# Patient Record
Sex: Female | Born: 1975 | Hispanic: Yes | Smoking: Never smoker
Health system: Southern US, Community
[De-identification: ages and names within clinical notes are randomized; demographics above are authoritative.]

---

## 2010-11-27 ENCOUNTER — Emergency Department: Payer: Self-pay | Admitting: Emergency Medicine

## 2013-10-07 HISTORY — PX: BREAST SURGERY: SHX581

## 2017-10-07 HISTORY — PX: LIPOSUCTION: SHX10

## 2019-02-10 ENCOUNTER — Other Ambulatory Visit: Payer: Self-pay | Admitting: Family Medicine

## 2019-02-10 DIAGNOSIS — Z369 Encounter for antenatal screening, unspecified: Secondary | ICD-10-CM

## 2019-02-11 ENCOUNTER — Other Ambulatory Visit: Payer: Self-pay | Admitting: Family Medicine

## 2019-02-11 DIAGNOSIS — O26851 Spotting complicating pregnancy, first trimester: Secondary | ICD-10-CM

## 2019-02-11 DIAGNOSIS — Z3481 Encounter for supervision of other normal pregnancy, first trimester: Secondary | ICD-10-CM

## 2019-02-15 ENCOUNTER — Other Ambulatory Visit: Payer: Self-pay

## 2019-02-15 ENCOUNTER — Ambulatory Visit
Admission: RE | Admit: 2019-02-15 | Discharge: 2019-02-15 | Disposition: A | Discharge status: Home / Self Care | Payer: Self-pay | Source: Ambulatory Visit | Attending: Maternal & Fetal Medicine | Admitting: Maternal & Fetal Medicine

## 2019-02-15 ENCOUNTER — Ambulatory Visit: Admission: RE | Admit: 2019-02-15 | Payer: Self-pay | Source: Ambulatory Visit

## 2019-02-15 DIAGNOSIS — O09521 Supervision of elderly multigravida, first trimester: Secondary | ICD-10-CM | POA: Insufficient documentation

## 2019-02-15 NOTE — Progress Notes (Addendum)
Virtual Visit via Video Note  I connected with Debbie Burns on Feb 15, 2019 at  3:00 PM EDT by a video enabled telemedicine application and verified that I am speaking with the correct person using two identifiers.  Referring Provider:   Charlynne Pander, [facility] Length of Consultation: 55 minutes  Ms. Debbie Burns was referred to [facility] [place] for genetic counseling because of advanced maternal age.  The patient will be 43 years old at the time of delivery.  This note summarizes the information we discussed.    We explained that the chance of a chromosome abnormality increases with maternal age.  Chromosomes and examples of chromosome problems were reviewed.  Humans typically have 46 chromosomes in each cell, with half passed through each sperm and egg.  Any change in the number or structure of chromosomes can increase the risk of problems in the physical and mental development of a pregnancy.   Based upon age of the patient, the chance of any chromosome abnormality was 1 in 6. The chance of Down syndrome, the most common chromosome problem associated with maternal age, was 1 in 16.  The risk of chromosome problems is in addition to the 3% general population risk for birth defects and mental retardation.  The greatest chance, of course, is that the baby would be born in good health.  We discussed the following prenatal screening and testing options for this pregnancy:  Cell free fetal DNA testing uses a maternal blood sample to determine whether or not the baby may have either Down syndrome, trisomy 15, or trisomy 62.  This test utilizes DNA sequencing technology to isolate circulating cell free fetal DNA from maternal plasma.  The fetal DNA can then be analyzed for DNA sequences that are derived from the three most common chromosomes involved in aneuploidy, chromosomes 13, 18, and 21.  If the overall amount of DNA is greater than the expected level for any of these  chromosomes, aneuploidy is suspected.  While we do not consider it a replacement for invasive testing and karyotype analysis, a negative result from this testing would be reassuring, though not a guarantee of a normal chromosome complement for the baby.  An abnormal result is certainly suggestive of an abnormal chromosome complement, though we would still recommend CVS or amniocentesis to confirm any findings from this testing.  The chorionic villus sampling procedure is available for first trimester chromosome analysis.  This involves the withdrawal of a small amount of chorionic villi (tissue from the developing placenta).  Risk of pregnancy loss is estimated to be approximately 1 in 200 to 1 in 100 (0.5 to 1%).  There is approximately a 1% (1 in 100) chance that the CVS chromosome results will be unclear.  Chorionic villi cannot be tested for neural tube defects.     Targeted ultrasound uses high frequency sound waves to create an image of the developing fetus.  An ultrasound is often recommended as a routine means of evaluating the pregnancy.  It is also used to screen for fetal anatomy problems (for example, a heart defect) that might be suggestive of a chromosomal or other abnormality.   Amniocentesis involves the removal of a small amount of amniotic fluid from the sac surrounding the fetus with the use of a thin needle inserted through the maternal abdomen and uterus.  Ultrasound guidance is used throughout the procedure.  Fetal cells from amniotic fluid are directly evaluated and > 99.5% of chromosome problems and >  98% of open neural tube defects can be detected. This procedure is generally performed after the 15th week of pregnancy.  The main risks to this procedure include complications leading to miscarriage in less than 1 in 200 cases (0.5%).  First trimester screening and maternal serum screening for aneuploidy were not discussed in detail given the restrictions of COVID19 and efforts minimize  time in clinics.  We do recommend maternal serum AFP only screening for open neural tube defects in the second trimester.   Cystic Fibrosis and Spinal Muscular Atrophy (SMA) screening were also discussed with the patient. Both conditions are recessive, which means that both parents must be carriers in order to have a child with the disease.  Cystic fibrosis (CF) is one of the most common genetic conditions in persons of Caucasian ancestry.  This condition occurs in approximately 1 in 2,500 Caucasian persons and results in thickened secretions in the lungs, digestive, and reproductive systems.  For a baby to be at risk for having CF, both of the parents must be carriers for this condition.  Approximately 1 in 7125 Caucasian persons is a carrier for CF.  Current carrier testing looks for the most common mutations in the gene for CF and can detect approximately 90% of carriers in the Caucasian population.  This means that the carrier screening can greatly reduce, but cannot eliminate, the chance for an individual to have a child with CF.  If an individual is found to be a carrier for CF, then carrier testing would be available for the partner. As part of [place]'s newborn screening profile, all babies born in the state of [place]  will have a two-tier screening process.  Specimens are first tested to determine the concentration of immunoreactive trypsinogen (IRT).  The top 5% of specimens with the highest IRT values then undergo DNA testing using a panel of over 40 common CF mutations. SMA is a neurodegenerative disorder that leads to atrophy of skeletal muscle and overall weakness.  This condition is also more prevalent in the Caucasian population, with 1 in 40-1 in 60 persons being a carrier and 1 in 6,000-1 in 10,000 children being affected.  There are multiple forms of the disease, with some causing death in infancy to other forms with survival into adulthood.  The genetics of SMA is complex, but  carrier screening can detect up to 95% of carriers in the Caucasian population.  Similar to CF, a negative result can greatly reduce, but cannot eliminate, the chance to have a child with SMA. Carrier screening for hemoglobinopathies was also offered.  The patient and her partner are both of [place] ancestry.  We obtained a detailed family history and pregnancy history.  The patient reported one niece with problems walking that were the result of an illness with a high fever in infancy.  If her condition is related to the illness, then we would not expect an increased risk for other family members to have a similar condition.  The patient also reported that her father and paternal grandmother both have been diagnosed with "stomach" cancer. The grandmother passed away from this condition at 41 years of age.   We dicussed that most types of cancer occur by chance but that when multiple family members have the same type of cancer, it increases the chance that there may be some underlying genetic predisposition.  If this family would like to speak with a cancer genetic counselor about this history, we are happy to provide that contact  information.  If they would like [place] to review medical records on the diagnosis in her father, we would need his written permission to obtain those records. Please reach out to our office if more information is desired.Lastly, the patient stated that she has been diagnosed with high triglycerides.  We reviewed that this is mos2t often the result of a combination of genetic and environmental factors.  We encouraged her to follow up with her doctor about this diagnosis. The remainder of the family history is unremarkable for birth defects, developmental delays, recurrent pregnancy loss or known chromosome abnormalities.  Ms. Debbie Burns reported that this is the first pregnancy with her current partner.  She has a healthy 46 year old daughter from a prior relationship and had two  elective pregnancy terminations.  She reported light spotting in the last week which has resolved but no other complications or exposures in this pregnancy that would be expected to increase the risk for birth defects.  After consideration of the options, Ms. Debbie Burns elected to proceed with MaterniT21 PLUS with SCA. This will be drawn at her ultrasound visit on 02/18/19. She declined carrier screening for CF, SMA and hemoglobinopathies.  Because the patient is greater than 70 years old at the time of delivery, we also recommend a fetal echocardiogram after [redacted] weeks gestation.  An ultrasound is scheduled for Thursday, May 14 at 3pm at [facility]. Please refer to the ultrasound report for details of that study.  Ms. Debbie Burns was encouraged to call with questions or concerns.  We can be contacted at [phone].   Tests Ordered: MaterniT21 PLUS with SCA to be drawn at [facility] on 02/18/19.  I provided 55 minutes of non-face-to-face time during this encounter.   Katrina Stack

## 2019-02-17 ENCOUNTER — Ambulatory Visit
Admission: RE | Admit: 2019-02-17 | Discharge: 2019-02-17 | Disposition: A | Discharge status: Home / Self Care | Payer: PRIVATE HEALTH INSURANCE | Source: Ambulatory Visit | Attending: Family Medicine | Admitting: Family Medicine

## 2019-02-17 ENCOUNTER — Encounter (INDEPENDENT_AMBULATORY_CARE_PROVIDER_SITE_OTHER): Payer: Self-pay

## 2019-02-17 ENCOUNTER — Other Ambulatory Visit: Payer: Self-pay | Admitting: Family Medicine

## 2019-02-17 ENCOUNTER — Other Ambulatory Visit: Payer: Self-pay

## 2019-02-17 DIAGNOSIS — Z3481 Encounter for supervision of other normal pregnancy, first trimester: Secondary | ICD-10-CM

## 2019-02-17 DIAGNOSIS — O26851 Spotting complicating pregnancy, first trimester: Secondary | ICD-10-CM

## 2019-02-18 ENCOUNTER — Telehealth: Payer: Self-pay | Admitting: Obstetrics and Gynecology

## 2019-02-18 ENCOUNTER — Ambulatory Visit: Payer: Self-pay

## 2019-02-18 ENCOUNTER — Inpatient Hospital Stay: Admission: RE | Admit: 2019-02-18 | Payer: Self-pay | Source: Ambulatory Visit

## 2019-02-18 ENCOUNTER — Other Ambulatory Visit: Payer: Self-pay

## 2019-02-18 NOTE — Telephone Encounter (Signed)
Ms. Debbie Burns did not show for her ultrasound today at [facility].  I called her with the aid of a Spanish interpreter to follow up.  She stated that she had gotten another call after her genetic counseling consultation saying that her ultrasound was Wednesday at [facility].  So, she had an ultrasound yesterday, 02/17/19, which showed a twin gestation at 14 weeks.  Therefore, she did not think she needed to come today.  I explained that the MaterniT21 blood test could still be ordered any time if desired.  The other option I gave her was to come to [facility] for her detailed anatomy ultrasound at [redacted] weeks gestation and have the MaterniT21 testing drawn at that time.  She preferred that option and was scheduled for the ultrasound and labs on March 18, 2019 at 2:00pm.  If there are any questions or concerns, please contact our office at [phone].  Cherly Anderson, MS, CGC

## 2019-03-15 ENCOUNTER — Other Ambulatory Visit: Payer: Self-pay

## 2019-03-15 DIAGNOSIS — O09521 Supervision of elderly multigravida, first trimester: Secondary | ICD-10-CM

## 2019-03-15 NOTE — Progress Notes (Signed)
History reviewed. Agree with history as outlined in [facility] Well's note.   

## 2019-03-18 ENCOUNTER — Other Ambulatory Visit: Payer: No Typology Code available for payment source

## 2019-03-18 ENCOUNTER — Other Ambulatory Visit: Payer: Self-pay

## 2019-03-18 ENCOUNTER — Ambulatory Visit
Admission: RE | Admit: 2019-03-18 | Discharge: 2019-03-18 | Disposition: A | Discharge status: Home / Self Care | Payer: PRIVATE HEALTH INSURANCE | Source: Ambulatory Visit | Attending: Obstetrics and Gynecology | Admitting: Obstetrics and Gynecology

## 2019-03-18 DIAGNOSIS — O321XX2 Maternal care for breech presentation, fetus 2: Secondary | ICD-10-CM | POA: Insufficient documentation

## 2019-03-18 DIAGNOSIS — O09522 Supervision of elderly multigravida, second trimester: Secondary | ICD-10-CM | POA: Insufficient documentation

## 2019-03-18 DIAGNOSIS — Z3A18 18 weeks gestation of pregnancy: Secondary | ICD-10-CM | POA: Diagnosis not present

## 2019-03-18 DIAGNOSIS — O30032 Twin pregnancy, monochorionic/diamniotic, second trimester: Secondary | ICD-10-CM | POA: Insufficient documentation

## 2019-03-18 DIAGNOSIS — O09521 Supervision of elderly multigravida, first trimester: Secondary | ICD-10-CM

## 2019-03-29 ENCOUNTER — Other Ambulatory Visit: Payer: Self-pay

## 2019-03-29 DIAGNOSIS — O09521 Supervision of elderly multigravida, first trimester: Secondary | ICD-10-CM

## 2019-04-01 ENCOUNTER — Other Ambulatory Visit: Payer: Self-pay

## 2019-04-01 ENCOUNTER — Ambulatory Visit
Admission: RE | Admit: 2019-04-01 | Discharge: 2019-04-01 | Disposition: A | Discharge status: Home / Self Care | Payer: PRIVATE HEALTH INSURANCE | Source: Ambulatory Visit | Attending: Obstetrics and Gynecology | Admitting: Obstetrics and Gynecology

## 2019-04-01 DIAGNOSIS — O09521 Supervision of elderly multigravida, first trimester: Secondary | ICD-10-CM

## 2019-04-01 DIAGNOSIS — Z3A20 20 weeks gestation of pregnancy: Secondary | ICD-10-CM | POA: Insufficient documentation

## 2019-04-01 DIAGNOSIS — O09522 Supervision of elderly multigravida, second trimester: Secondary | ICD-10-CM | POA: Insufficient documentation

## 2019-04-01 LAB — MATERNIT21 PLUS CORE+SCA
Fetal Fraction: 12
Result (T21): NEGATIVE
Trisomy 13 (Patau syndrome): NEGATIVE
Trisomy 18 (Edwards syndrome): NEGATIVE
Trisomy 21 (Down syndrome): NEGATIVE

## 2019-04-02 ENCOUNTER — Telehealth: Payer: Self-pay | Admitting: Obstetrics and Gynecology

## 2019-04-02 NOTE — Telephone Encounter (Signed)
The patient was informed of the results of her recent MaterniT21 testing which yielded NEGATIVE results.  The patient's specimen showed DNA consistent with two copies of chromosomes 21, 18 and 13.  The sensitivity for trisomy 41, trisomy 60 and trisomy 34 using this testing are reported as 99.1%, 99.9% and 91.7% respectively.  Thus, while the results of this testing are highly accurate, they are not considered diagnostic at this time.  Should more definitive information be desired, the patient may still consider amniocentesis.   As requested to know by the patient, sex chromosome analysis was included for this sample.  Results are consistent with at least one female fetus, as Y chromosome material was detected in the sample.  Because this is a monochorionic gestation, it is expected that both are female. This is predicted with >99% accuracy.  A maternal serum AFP only should be considered if screening for neural tube defects is desired.  We may be reached at [phone] with any questions or concerns.   Wilburt Finlay, MS, CGC

## 2019-04-12 ENCOUNTER — Other Ambulatory Visit: Payer: No Typology Code available for payment source

## 2019-04-12 ENCOUNTER — Other Ambulatory Visit: Payer: Self-pay

## 2019-04-12 DIAGNOSIS — O09521 Supervision of elderly multigravida, first trimester: Secondary | ICD-10-CM

## 2019-04-15 ENCOUNTER — Other Ambulatory Visit: Payer: Self-pay

## 2019-04-15 ENCOUNTER — Ambulatory Visit
Admission: RE | Admit: 2019-04-15 | Discharge: 2019-04-15 | Disposition: A | Discharge status: Home / Self Care | Payer: No Typology Code available for payment source | Source: Ambulatory Visit | Attending: Maternal & Fetal Medicine | Admitting: Maternal & Fetal Medicine

## 2019-04-15 DIAGNOSIS — O09522 Supervision of elderly multigravida, second trimester: Secondary | ICD-10-CM | POA: Insufficient documentation

## 2019-04-15 DIAGNOSIS — O09521 Supervision of elderly multigravida, first trimester: Secondary | ICD-10-CM

## 2019-04-15 DIAGNOSIS — Z3A22 22 weeks gestation of pregnancy: Secondary | ICD-10-CM | POA: Diagnosis not present

## 2019-04-26 ENCOUNTER — Other Ambulatory Visit: Payer: Self-pay | Admitting: Maternal & Fetal Medicine

## 2019-04-26 ENCOUNTER — Other Ambulatory Visit: Payer: Self-pay

## 2019-04-26 DIAGNOSIS — O30032 Twin pregnancy, monochorionic/diamniotic, second trimester: Secondary | ICD-10-CM

## 2019-04-26 DIAGNOSIS — O09521 Supervision of elderly multigravida, first trimester: Secondary | ICD-10-CM

## 2019-04-26 DIAGNOSIS — O09522 Supervision of elderly multigravida, second trimester: Secondary | ICD-10-CM

## 2019-04-29 ENCOUNTER — Ambulatory Visit
Admission: RE | Admit: 2019-04-29 | Discharge: 2019-04-29 | Disposition: A | Discharge status: Home / Self Care | Payer: PRIVATE HEALTH INSURANCE | Source: Ambulatory Visit | Attending: Obstetrics and Gynecology | Admitting: Obstetrics and Gynecology

## 2019-04-29 ENCOUNTER — Other Ambulatory Visit: Payer: Self-pay

## 2019-04-29 DIAGNOSIS — Z3A24 24 weeks gestation of pregnancy: Secondary | ICD-10-CM | POA: Insufficient documentation

## 2019-04-29 DIAGNOSIS — O09522 Supervision of elderly multigravida, second trimester: Secondary | ICD-10-CM

## 2019-04-29 DIAGNOSIS — O30032 Twin pregnancy, monochorionic/diamniotic, second trimester: Secondary | ICD-10-CM | POA: Diagnosis present

## 2019-04-29 DIAGNOSIS — O09521 Supervision of elderly multigravida, first trimester: Secondary | ICD-10-CM

## 2019-05-10 ENCOUNTER — Other Ambulatory Visit: Payer: Self-pay

## 2019-05-10 DIAGNOSIS — O09522 Supervision of elderly multigravida, second trimester: Secondary | ICD-10-CM

## 2019-05-13 ENCOUNTER — Other Ambulatory Visit: Payer: Self-pay

## 2019-05-13 ENCOUNTER — Ambulatory Visit
Admission: RE | Admit: 2019-05-13 | Discharge: 2019-05-13 | Disposition: A | Discharge status: Home / Self Care | Payer: PRIVATE HEALTH INSURANCE | Source: Ambulatory Visit | Attending: Obstetrics and Gynecology | Admitting: Obstetrics and Gynecology

## 2019-05-13 DIAGNOSIS — O09522 Supervision of elderly multigravida, second trimester: Secondary | ICD-10-CM | POA: Insufficient documentation

## 2019-05-13 DIAGNOSIS — O321XX2 Maternal care for breech presentation, fetus 2: Secondary | ICD-10-CM | POA: Insufficient documentation

## 2019-05-13 DIAGNOSIS — O30032 Twin pregnancy, monochorionic/diamniotic, second trimester: Secondary | ICD-10-CM | POA: Diagnosis not present

## 2019-05-13 DIAGNOSIS — Z3A26 26 weeks gestation of pregnancy: Secondary | ICD-10-CM | POA: Insufficient documentation

## 2019-05-20 ENCOUNTER — Other Ambulatory Visit: Payer: Self-pay

## 2019-05-20 ENCOUNTER — Other Ambulatory Visit: Payer: Self-pay | Admitting: Obstetrics and Gynecology

## 2019-05-20 DIAGNOSIS — O30033 Twin pregnancy, monochorionic/diamniotic, third trimester: Secondary | ICD-10-CM

## 2019-05-20 DIAGNOSIS — O30032 Twin pregnancy, monochorionic/diamniotic, second trimester: Secondary | ICD-10-CM

## 2019-05-20 DIAGNOSIS — O09521 Supervision of elderly multigravida, first trimester: Secondary | ICD-10-CM

## 2019-05-27 ENCOUNTER — Other Ambulatory Visit: Payer: Self-pay

## 2019-05-27 ENCOUNTER — Ambulatory Visit
Admission: RE | Admit: 2019-05-27 | Discharge: 2019-05-27 | Disposition: A | Discharge status: Home / Self Care | Payer: PRIVATE HEALTH INSURANCE | Source: Ambulatory Visit | Attending: Obstetrics and Gynecology | Admitting: Obstetrics and Gynecology

## 2019-05-27 DIAGNOSIS — O09523 Supervision of elderly multigravida, third trimester: Secondary | ICD-10-CM | POA: Diagnosis not present

## 2019-05-27 DIAGNOSIS — O30033 Twin pregnancy, monochorionic/diamniotic, third trimester: Secondary | ICD-10-CM

## 2019-05-27 DIAGNOSIS — Z3A28 28 weeks gestation of pregnancy: Secondary | ICD-10-CM | POA: Insufficient documentation

## 2019-05-27 DIAGNOSIS — O30032 Twin pregnancy, monochorionic/diamniotic, second trimester: Secondary | ICD-10-CM

## 2019-06-07 ENCOUNTER — Other Ambulatory Visit: Payer: Self-pay

## 2019-06-07 DIAGNOSIS — O09523 Supervision of elderly multigravida, third trimester: Secondary | ICD-10-CM

## 2019-06-10 ENCOUNTER — Other Ambulatory Visit: Payer: No Typology Code available for payment source

## 2019-06-10 ENCOUNTER — Ambulatory Visit
Admission: RE | Admit: 2019-06-10 | Discharge: 2019-06-10 | Disposition: A | Discharge status: Home / Self Care | Payer: PRIVATE HEALTH INSURANCE | Source: Ambulatory Visit | Attending: Maternal & Fetal Medicine | Admitting: Maternal & Fetal Medicine

## 2019-06-10 ENCOUNTER — Other Ambulatory Visit: Payer: Self-pay

## 2019-06-10 DIAGNOSIS — O321XX2 Maternal care for breech presentation, fetus 2: Secondary | ICD-10-CM | POA: Insufficient documentation

## 2019-06-10 DIAGNOSIS — O09523 Supervision of elderly multigravida, third trimester: Secondary | ICD-10-CM | POA: Insufficient documentation

## 2019-06-10 DIAGNOSIS — Z3A30 30 weeks gestation of pregnancy: Secondary | ICD-10-CM | POA: Insufficient documentation

## 2019-06-10 DIAGNOSIS — O30033 Twin pregnancy, monochorionic/diamniotic, third trimester: Secondary | ICD-10-CM | POA: Insufficient documentation

## 2019-06-21 ENCOUNTER — Other Ambulatory Visit: Payer: Self-pay

## 2019-06-21 DIAGNOSIS — O09523 Supervision of elderly multigravida, third trimester: Secondary | ICD-10-CM

## 2019-06-24 ENCOUNTER — Ambulatory Visit
Admission: RE | Admit: 2019-06-24 | Discharge: 2019-06-24 | Disposition: A | Discharge status: Home / Self Care | Payer: PRIVATE HEALTH INSURANCE | Source: Ambulatory Visit | Attending: Maternal & Fetal Medicine | Admitting: Maternal & Fetal Medicine

## 2019-06-24 ENCOUNTER — Other Ambulatory Visit: Payer: Self-pay

## 2019-06-24 DIAGNOSIS — O30033 Twin pregnancy, monochorionic/diamniotic, third trimester: Secondary | ICD-10-CM | POA: Insufficient documentation

## 2019-06-24 DIAGNOSIS — O09523 Supervision of elderly multigravida, third trimester: Secondary | ICD-10-CM | POA: Insufficient documentation

## 2019-06-24 DIAGNOSIS — Z3A32 32 weeks gestation of pregnancy: Secondary | ICD-10-CM | POA: Diagnosis not present

## 2019-07-05 ENCOUNTER — Other Ambulatory Visit: Payer: Self-pay

## 2019-07-05 DIAGNOSIS — O09523 Supervision of elderly multigravida, third trimester: Secondary | ICD-10-CM

## 2019-07-08 ENCOUNTER — Ambulatory Visit
Admission: RE | Admit: 2019-07-08 | Discharge: 2019-07-08 | Disposition: A | Discharge status: Home / Self Care | Payer: PRIVATE HEALTH INSURANCE | Source: Ambulatory Visit | Attending: Maternal & Fetal Medicine | Admitting: Maternal & Fetal Medicine

## 2019-07-08 ENCOUNTER — Other Ambulatory Visit: Payer: Self-pay

## 2019-07-08 DIAGNOSIS — O30033 Twin pregnancy, monochorionic/diamniotic, third trimester: Secondary | ICD-10-CM | POA: Diagnosis not present

## 2019-07-08 DIAGNOSIS — O09523 Supervision of elderly multigravida, third trimester: Secondary | ICD-10-CM | POA: Insufficient documentation

## 2019-07-08 DIAGNOSIS — Z3A34 34 weeks gestation of pregnancy: Secondary | ICD-10-CM | POA: Diagnosis not present

## 2019-07-12 ENCOUNTER — Other Ambulatory Visit: Payer: Self-pay

## 2019-07-12 DIAGNOSIS — O09523 Supervision of elderly multigravida, third trimester: Secondary | ICD-10-CM

## 2019-07-15 ENCOUNTER — Other Ambulatory Visit: Payer: Self-pay

## 2019-07-15 ENCOUNTER — Encounter
Admission: RE | Admit: 2019-07-15 | Discharge: 2019-07-15 | Disposition: A | Discharge status: Home / Self Care | Payer: PRIVATE HEALTH INSURANCE | Source: Ambulatory Visit | Attending: Obstetrics & Gynecology | Admitting: Obstetrics & Gynecology

## 2019-07-15 ENCOUNTER — Ambulatory Visit
Admission: RE | Admit: 2019-07-15 | Discharge: 2019-07-15 | Disposition: A | Discharge status: Home / Self Care | Payer: PRIVATE HEALTH INSURANCE | Source: Ambulatory Visit | Attending: Maternal & Fetal Medicine | Admitting: Maternal & Fetal Medicine

## 2019-07-15 DIAGNOSIS — O09523 Supervision of elderly multigravida, third trimester: Secondary | ICD-10-CM | POA: Insufficient documentation

## 2019-07-15 DIAGNOSIS — Z3A35 35 weeks gestation of pregnancy: Secondary | ICD-10-CM | POA: Diagnosis not present

## 2019-07-15 LAB — CBC
HCT: 35.4 % — ABNORMAL LOW (ref 36.0–46.0)
Hemoglobin: 11.8 g/dL — ABNORMAL LOW (ref 12.0–15.0)
MCH: 31.4 pg (ref 26.0–34.0)
MCHC: 33.3 g/dL (ref 30.0–36.0)
MCV: 94.1 fL (ref 80.0–100.0)
Platelets: 176 10*3/uL (ref 150–400)
RBC: 3.76 MIL/uL — ABNORMAL LOW (ref 3.87–5.11)
RDW: 13.8 % (ref 11.5–15.5)
WBC: 9 10*3/uL (ref 4.0–10.5)
nRBC: 0 % (ref 0.0–0.2)

## 2019-07-15 LAB — TYPE AND SCREEN
ABO/RH(D): O POS
Antibody Screen: NEGATIVE
Extend sample reason: UNDETERMINED

## 2019-07-15 NOTE — Patient Instructions (Signed)
Your procedure is scheduled on: Friday 07/23/19 Su procedimiento est programado para: Report to Medical mall entrance at 6:00 am Presntese a: .   Remember: Instructions that are not followed completely may result in serious medical risk, up to and including death, or upon the discretion of your surgeon and anesthesiologist your surgery may need to be rescheduled.  Recuerde: Las instrucciones que no se siguen completamente Armed forces logistics/support/administrative officer en un riesgo de salud grave, incluyendo hasta la [place] o a discrecin de su cirujano y Scientific laboratory technician, su ciruga se puede posponer.   __X__ 1. Do not eat food or drink liquids after midnight. No gum chewing or hard candies.  No coma alimentos ni tome lquidos despus de la medianoche.  No mastique chicle ni caramelos  duros.     __X__ 2. No alcohol for 24 hours before or after surgery.    No tome alcohol durante las 24 horas antes ni despus de la [place].   ____ 3. Bring all medications with you on the day of surgery if instructed.    Lleve todos los medicamentos con usted el da de su ciruga si se le ha indicado as.   __X__ 4. Notify your doctor if there is any change in your medical condition (cold, fever,                             infections).    Informe a su mdico si hay algn cambio en su condicin mdica (resfriado, fiebre, infecciones).   Do not wear jewelry, make-up, hairpins, clips or nail polish.  No use joyas, maquillajes, pinzas/ganchos para el cabello ni esmalte de uas.  Do not wear lotions, powders, or perfumes. .  No use lociones, polvos o perfumes.  .    Do not shave 48 hours prior to surgery. Men may shave face and neck.  No se afeite 48 horas antes de la [place].  Los hombres pueden [employer] cara y el cuello.   Do not bring valuables to the hospital.   No lleve objetos de valor al hospital.  [facility] is not responsible for any belongings or valuables.  [place] no se hace responsable de ningn tipo de  pertenencias u objetos de Licensed conveyancer.               Contacts, dentures or bridgework may not be worn into surgery.  Los lentes de [place], las dentaduras postizas o puentes no se pueden usar en la [place].  Leave your suitcase in the car. After surgery it may be brought to your room.  Deje su maleta en el auto.  Despus de la ciruga podr traerla a su habitacin.  For patients admitted to the hospital, discharge time is determined by your treatment team.  Para los pacientes que sean ingresados al hospital, el tiempo en el cual se le dar de alta es determinado por su                equipo de [place].   Patients discharged the day of surgery will not be allowed to drive home. A los pacientes que se les da de alta el mismo da de la ciruga no se les permitir conducir a Higher education careers adviser.   Please read over the following fact sheets that you were given: Por favor lea las siguientes hojas de informacin que le dieron:    ____ Take these medicines the morning of surgery with A SIP OF WATER:  Tome estas medicinas la maana de la ciruga con [place]:  1.   2.   3.   4.       5.  6.  ____ Fleet Enema (as directed)          Enema de Fleet (segn lo indicado)    __X__ Use CHG Soap as directed          Utilice el jabn de CHG segn lo indicado  ____ Use inhalers on the day of surgery          Use los inhaladores el da de la ciruga  ____ Stop metformin 2 days prior to surgery          Deje de tomar el metformin 2 das antes de la ciruga    ____ Take 1/2 of usual insulin dose the night before surgery and none on the morning of surgery           Tome la mitad de la dosis habitual de insulina la noche antes de la [place] y no tome nada en la maana de la             ciruga  ____ Stop Coumadin/Plavix/aspirin on           Deje de tomar el Coumadin/Plavix/aspirina el da:  ____ Stop Anti-inflammatories on           Deje de tomar antiinflamatorios el da:   ____ Stop  supplements until after surgery            Deje de tomar suplementos hasta despus de la ciruga  ____ Bring C-Pap to the hospital          [place] al hospital

## 2019-07-16 LAB — RPR: RPR Ser Ql: NONREACTIVE

## 2019-07-19 ENCOUNTER — Other Ambulatory Visit: Payer: Self-pay

## 2019-07-19 DIAGNOSIS — O09523 Supervision of elderly multigravida, third trimester: Secondary | ICD-10-CM

## 2019-07-21 ENCOUNTER — Other Ambulatory Visit
Admission: RE | Admit: 2019-07-21 | Discharge: 2019-07-21 | Disposition: A | Discharge status: Home / Self Care | Payer: PRIVATE HEALTH INSURANCE | Source: Ambulatory Visit | Attending: Obstetrics & Gynecology | Admitting: Obstetrics & Gynecology

## 2019-07-21 ENCOUNTER — Other Ambulatory Visit: Payer: Self-pay

## 2019-07-21 LAB — SARS CORONAVIRUS 2 (TAT 6-24 HRS): SARS Coronavirus 2: NEGATIVE

## 2019-07-22 ENCOUNTER — Inpatient Hospital Stay
Admission: RE | Admit: 2019-07-22 | Discharge: 2019-07-22 | Disposition: A | Discharge status: Home / Self Care | Payer: No Typology Code available for payment source | Source: Ambulatory Visit

## 2019-07-22 ENCOUNTER — Other Ambulatory Visit: Payer: No Typology Code available for payment source

## 2019-07-23 ENCOUNTER — Inpatient Hospital Stay: Payer: PRIVATE HEALTH INSURANCE | Admitting: Certified Registered Nurse Anesthetist

## 2019-07-23 ENCOUNTER — Inpatient Hospital Stay
Admission: RE | Admit: 2019-07-23 | Discharge: 2019-07-25 | DRG: 787 | Disposition: A | Discharge status: Home / Self Care | Payer: PRIVATE HEALTH INSURANCE | Attending: Obstetrics & Gynecology | Admitting: Obstetrics & Gynecology

## 2019-07-23 ENCOUNTER — Other Ambulatory Visit: Payer: Self-pay

## 2019-07-23 ENCOUNTER — Encounter
Admission: RE | Disposition: A | Discharge status: Home / Self Care | Payer: Self-pay | Source: Home / Self Care | Attending: Obstetrics & Gynecology

## 2019-07-23 DIAGNOSIS — Z3A36 36 weeks gestation of pregnancy: Secondary | ICD-10-CM

## 2019-07-23 DIAGNOSIS — O9081 Anemia of the puerperium: Secondary | ICD-10-CM | POA: Diagnosis not present

## 2019-07-23 DIAGNOSIS — Z20828 Contact with and (suspected) exposure to other viral communicable diseases: Secondary | ICD-10-CM | POA: Diagnosis present

## 2019-07-23 DIAGNOSIS — O43123 Velamentous insertion of umbilical cord, third trimester: Secondary | ICD-10-CM | POA: Diagnosis present

## 2019-07-23 DIAGNOSIS — O30039 Twin pregnancy, monochorionic/diamniotic, unspecified trimester: Secondary | ICD-10-CM | POA: Diagnosis present

## 2019-07-23 DIAGNOSIS — D62 Acute posthemorrhagic anemia: Secondary | ICD-10-CM | POA: Diagnosis not present

## 2019-07-23 DIAGNOSIS — O30033 Twin pregnancy, monochorionic/diamniotic, third trimester: Secondary | ICD-10-CM | POA: Diagnosis present

## 2019-07-23 LAB — BASIC METABOLIC PANEL
Anion gap: 10 (ref 5–15)
BUN: 12 mg/dL (ref 6–20)
CO2: 18 mmol/L — ABNORMAL LOW (ref 22–32)
Calcium: 8.3 mg/dL — ABNORMAL LOW (ref 8.9–10.3)
Chloride: 110 mmol/L (ref 98–111)
Creatinine, Ser: 0.7 mg/dL (ref 0.44–1.00)
GFR calc Af Amer: >60 mL/min (ref >60)
GFR calc non Af Amer: >60 mL/min (ref >60)
Glucose, Bld: 90 mg/dL (ref 70–99)
Potassium: 3.9 mmol/L (ref 3.5–5.1)
Sodium: 138 mmol/L (ref 135–145)

## 2019-07-23 LAB — TYPE AND SCREEN
ABO/RH(D): O POS
Antibody Screen: NEGATIVE

## 2019-07-23 LAB — CBC
HCT: 35.7 % — ABNORMAL LOW (ref 36.0–46.0)
HCT: 32.8 % — ABNORMAL LOW (ref 36.0–46.0)
Hemoglobin: 12 g/dL (ref 12.0–15.0)
Hemoglobin: 11.3 g/dL — ABNORMAL LOW (ref 12.0–15.0)
MCH: 31.3 pg (ref 26.0–34.0)
MCH: 32 pg (ref 26.0–34.0)
MCHC: 33.6 g/dL (ref 30.0–36.0)
MCHC: 34.5 g/dL (ref 30.0–36.0)
MCV: 93 fL (ref 80.0–100.0)
MCV: 92.9 fL (ref 80.0–100.0)
Platelets: 143 10*3/uL — ABNORMAL LOW (ref 150–400)
Platelets: 144 10*3/uL — ABNORMAL LOW (ref 150–400)
RBC: 3.84 MIL/uL — ABNORMAL LOW (ref 3.87–5.11)
RBC: 3.53 MIL/uL — ABNORMAL LOW (ref 3.87–5.11)
RDW: 14.1 % (ref 11.5–15.5)
RDW: 14.4 % (ref 11.5–15.5)
WBC: 10.3 10*3/uL (ref 4.0–10.5)
WBC: 15.6 10*3/uL — ABNORMAL HIGH (ref 4.0–10.5)
nRBC: 0.2 % (ref 0.0–0.2)
nRBC: 0 % (ref 0.0–0.2)

## 2019-07-23 LAB — OB RESULTS CONSOLE RUBELLA ANTIBODY, IGM: Rubella: IMMUNE

## 2019-07-23 LAB — OB RESULTS CONSOLE HEPATITIS B SURFACE ANTIGEN: Hepatitis B Surface Ag: NEGATIVE

## 2019-07-23 LAB — OB RESULTS CONSOLE GBS: GBS: NEGATIVE

## 2019-07-23 LAB — OB RESULTS CONSOLE HIV ANTIBODY (ROUTINE TESTING): HIV: NONREACTIVE

## 2019-07-23 LAB — OB RESULTS CONSOLE GC/CHLAMYDIA
Chlamydia: NEGATIVE
Gonorrhea: NEGATIVE

## 2019-07-23 LAB — OB RESULTS CONSOLE VARICELLA ZOSTER ANTIBODY, IGG: Varicella: IMMUNE

## 2019-07-23 LAB — OB RESULTS CONSOLE RPR: RPR: NONREACTIVE

## 2019-07-23 SURGERY — Surgical Case
Anesthesia: Spinal

## 2019-07-23 MED ORDER — SOD CITRATE-CITRIC ACID 500-334 MG/5ML PO SOLN
ORAL | Status: AC
  Administered 2019-07-23: 30 mL
  Filled 2019-07-23: qty 30

## 2019-07-23 MED ORDER — COCONUT OIL OIL
1.0000 "application " | TOPICAL_OIL | Status: DC | PRN
  Filled 2019-07-23: qty 120

## 2019-07-23 MED ORDER — OXYTOCIN 40 UNITS IN NORMAL SALINE INFUSION - SIMPLE MED
2.5000 [IU]/h | INTRAVENOUS | Status: AC
  Administered 2019-07-23: 2.5 [IU]/h via INTRAVENOUS

## 2019-07-23 MED ORDER — OXYTOCIN 40 UNITS IN NORMAL SALINE INFUSION - SIMPLE MED
INTRAVENOUS | Status: AC
  Filled 2019-07-23: qty 1000

## 2019-07-23 MED ORDER — NALOXONE HCL 4 MG/10ML IJ SOLN
1.0000 ug/kg/h | INTRAVENOUS | Status: DC | PRN
  Filled 2019-07-23: qty 5

## 2019-07-23 MED ORDER — IBUPROFEN 600 MG PO TABS: 600.0000 mg | ORAL_TABLET | Freq: Four times a day (QID) | ORAL | Status: DC

## 2019-07-23 MED ORDER — SENNOSIDES-DOCUSATE SODIUM 8.6-50 MG PO TABS
2.0000 | ORAL_TABLET | ORAL | Status: DC
  Filled 2019-07-23: qty 2

## 2019-07-23 MED ORDER — LACTATED RINGERS IV BOLUS
1000.0000 mL | Freq: Once | INTRAVENOUS | Status: AC
  Administered 2019-07-23: 1000 mL via INTRAVENOUS

## 2019-07-23 MED ORDER — GABAPENTIN 300 MG PO CAPS
300.0000 mg | ORAL_CAPSULE | Freq: Every day | ORAL | Status: DC
  Administered 2019-07-23 – 2019-07-24 (×2): 300 mg via ORAL
  Filled 2019-07-23 (×2): qty 1

## 2019-07-23 MED ORDER — BUPIVACAINE LIPOSOME 1.3 % IJ SUSP: INTRAMUSCULAR | Status: DC | PRN

## 2019-07-23 MED ORDER — EPHEDRINE SULFATE 50 MG/ML IJ SOLN
INTRAMUSCULAR | Status: DC | PRN
  Administered 2019-07-23: 10 mg via INTRAVENOUS

## 2019-07-23 MED ORDER — SODIUM CHLORIDE 0.9 % IV SOLN
INTRAVENOUS | Status: DC | PRN
  Administered 2019-07-23: 70 mL

## 2019-07-23 MED ORDER — ACETAMINOPHEN 500 MG PO TABS
1000.0000 mg | ORAL_TABLET | Freq: Four times a day (QID) | ORAL | Status: DC
  Administered 2019-07-24 – 2019-07-25 (×4): 1000 mg via ORAL
  Filled 2019-07-23 (×5): qty 2

## 2019-07-23 MED ORDER — DIPHENHYDRAMINE HCL 50 MG/ML IJ SOLN: 12.5000 mg | INTRAMUSCULAR | Status: DC | PRN

## 2019-07-23 MED ORDER — PRENATAL MULTIVITAMIN CH
1.0000 | ORAL_TABLET | Freq: Every day | ORAL | Status: DC
  Filled 2019-07-23: qty 1

## 2019-07-23 MED ORDER — KETOROLAC TROMETHAMINE 30 MG/ML IJ SOLN: 30.0000 mg | Freq: Four times a day (QID) | INTRAMUSCULAR | Status: AC

## 2019-07-23 MED ORDER — OXYCODONE HCL 5 MG PO TABS: 5.0000 mg | ORAL_TABLET | ORAL | Status: DC | PRN

## 2019-07-23 MED ORDER — SODIUM CHLORIDE 0.9% FLUSH: 3.0000 mL | INTRAVENOUS | Status: DC | PRN

## 2019-07-23 MED ORDER — BUPIVACAINE HCL (PF) 0.5 % IJ SOLN
30.0000 mL | INTRAMUSCULAR | Status: DC
  Filled 2019-07-23: qty 30

## 2019-07-23 MED ORDER — OXYCODONE HCL 5 MG PO TABS
5.0000 mg | ORAL_TABLET | ORAL | Status: DC | PRN
  Administered 2019-07-25: 5 mg via ORAL
  Filled 2019-07-23: qty 1

## 2019-07-23 MED ORDER — WITCH HAZEL-GLYCERIN EX PADS: 1.0000 "application " | MEDICATED_PAD | CUTANEOUS | Status: DC | PRN

## 2019-07-23 MED ORDER — LACTATED RINGERS IV SOLN
INTRAVENOUS | Status: DC
  Administered 2019-07-23 – 2019-07-24 (×2): via INTRAVENOUS

## 2019-07-23 MED ORDER — MORPHINE SULFATE (PF) 0.5 MG/ML IJ SOLN
INTRAMUSCULAR | Status: AC
  Filled 2019-07-23: qty 10

## 2019-07-23 MED ORDER — SODIUM CHLORIDE 0.9% FLUSH
3.0000 mL | Freq: Two times a day (BID) | INTRAVENOUS | Status: DC
  Administered 2019-07-24: 22:00:00 3 mL via INTRAVENOUS

## 2019-07-23 MED ORDER — KETOROLAC TROMETHAMINE 30 MG/ML IJ SOLN
INTRAMUSCULAR | Status: DC | PRN
  Administered 2019-07-23: 30 mg via INTRAVENOUS

## 2019-07-23 MED ORDER — MEPERIDINE HCL 25 MG/ML IJ SOLN: 6.2500 mg | INTRAMUSCULAR | Status: DC | PRN

## 2019-07-23 MED ORDER — DIBUCAINE (PERIANAL) 1 % EX OINT: 1.0000 "application " | TOPICAL_OINTMENT | CUTANEOUS | Status: DC | PRN

## 2019-07-23 MED ORDER — ACETAMINOPHEN 650 MG RE SUPP
650.0000 mg | Freq: Once | RECTAL | Status: AC
  Administered 2019-07-23: 09:00:00 650 mg via RECTAL
  Filled 2019-07-23: qty 1

## 2019-07-23 MED ORDER — GLYCOPYRROLATE 0.2 MG/ML IJ SOLN
INTRAMUSCULAR | Status: DC | PRN
  Administered 2019-07-23: 0.2 mg via INTRAVENOUS

## 2019-07-23 MED ORDER — KETAMINE HCL 50 MG/ML IJ SOLN
INTRAMUSCULAR | Status: DC | PRN
  Administered 2019-07-23: 50 mg via INTRAMUSCULAR
  Administered 2019-07-23: 50 mg via INTRAVENOUS

## 2019-07-23 MED ORDER — NALBUPHINE HCL 10 MG/ML IJ SOLN: 5.0000 mg | INTRAMUSCULAR | Status: DC | PRN

## 2019-07-23 MED ORDER — ONDANSETRON HCL 4 MG/2ML IJ SOLN
INTRAMUSCULAR | Status: DC | PRN
  Administered 2019-07-23: 4 mg via INTRAVENOUS

## 2019-07-23 MED ORDER — FENTANYL CITRATE (PF) 100 MCG/2ML IJ SOLN
INTRAMUSCULAR | Status: DC | PRN
  Administered 2019-07-23: 85 ug via INTRAVENOUS
  Administered 2019-07-23: 25 ug via INTRAVENOUS
  Administered 2019-07-23: 15 ug via INTRATHECAL
  Administered 2019-07-23: 50 ug via INTRAVENOUS

## 2019-07-23 MED ORDER — ONDANSETRON HCL 4 MG/2ML IJ SOLN
INTRAMUSCULAR | Status: AC
  Administered 2019-07-23: 11:00:00 4 mg
  Filled 2019-07-23: qty 2

## 2019-07-23 MED ORDER — SODIUM CHLORIDE (PF) 0.9 % IJ SOLN
INTRAMUSCULAR | Status: AC
  Filled 2019-07-23: qty 50

## 2019-07-23 MED ORDER — CEFAZOLIN SODIUM-DEXTROSE 2-4 GM/100ML-% IV SOLN
2.0000 g | Freq: Once | INTRAVENOUS | Status: AC
  Administered 2019-07-23: 2 g via INTRAVENOUS
  Filled 2019-07-23: qty 100

## 2019-07-23 MED ORDER — NALOXONE HCL 0.4 MG/ML IJ SOLN: 0.4000 mg | INTRAMUSCULAR | Status: DC | PRN

## 2019-07-23 MED ORDER — SIMETHICONE 80 MG PO CHEW: 160.0000 mg | CHEWABLE_TABLET | Freq: Four times a day (QID) | ORAL | Status: DC | PRN

## 2019-07-23 MED ORDER — CARBOPROST TROMETHAMINE 250 MCG/ML IM SOLN
INTRAMUSCULAR | Status: AC
  Filled 2019-07-23: qty 1

## 2019-07-23 MED ORDER — LIDOCAINE HCL (PF) 1 % IJ SOLN
INTRAMUSCULAR | Status: DC | PRN
  Administered 2019-07-23: 3 mL via SUBCUTANEOUS

## 2019-07-23 MED ORDER — ACETAMINOPHEN 500 MG PO TABS: 1000.0000 mg | ORAL_TABLET | Freq: Four times a day (QID) | ORAL | Status: DC

## 2019-07-23 MED ORDER — MENTHOL 3 MG MT LOZG: 1.0000 | LOZENGE | OROMUCOSAL | Status: DC | PRN

## 2019-07-23 MED ORDER — FENTANYL CITRATE (PF) 100 MCG/2ML IJ SOLN
INTRAMUSCULAR | Status: AC
  Filled 2019-07-23: qty 2

## 2019-07-23 MED ORDER — NALBUPHINE HCL 10 MG/ML IJ SOLN: 5.0000 mg | Freq: Once | INTRAMUSCULAR | Status: DC | PRN

## 2019-07-23 MED ORDER — DIPHENHYDRAMINE HCL 25 MG PO CAPS: 25.0000 mg | ORAL_CAPSULE | ORAL | Status: DC | PRN

## 2019-07-23 MED ORDER — BUPIVACAINE HCL 0.5 % IJ SOLN
INTRAMUSCULAR | Status: DC | PRN
  Administered 2019-07-23: 30 mL

## 2019-07-23 MED ORDER — SOD CITRATE-CITRIC ACID 500-334 MG/5ML PO SOLN: 30.0000 mL | Freq: Once | ORAL | Status: DC

## 2019-07-23 MED ORDER — SODIUM CHLORIDE 0.9 % IV SOLN: 250.0000 mL | INTRAVENOUS | Status: DC

## 2019-07-23 MED ORDER — MISOPROSTOL 200 MCG PO TABS
ORAL_TABLET | ORAL | Status: AC
  Filled 2019-07-23: qty 5

## 2019-07-23 MED ORDER — ACETAMINOPHEN 325 MG PO TABS
650.0000 mg | ORAL_TABLET | Freq: Four times a day (QID) | ORAL | Status: AC
  Administered 2019-07-23 – 2019-07-24 (×4): 650 mg via ORAL
  Filled 2019-07-23 (×4): qty 2

## 2019-07-23 MED ORDER — SODIUM CHLORIDE 0.9 % IV SOLN
INTRAVENOUS | Status: DC | PRN
  Administered 2019-07-23: 40 ug/min via INTRAVENOUS

## 2019-07-23 MED ORDER — ONDANSETRON HCL 4 MG/2ML IJ SOLN: 4.0000 mg | Freq: Three times a day (TID) | INTRAMUSCULAR | Status: DC | PRN

## 2019-07-23 MED ORDER — OXYCODONE HCL 5 MG PO TABS: 10.0000 mg | ORAL_TABLET | ORAL | Status: DC | PRN

## 2019-07-23 MED ORDER — METHYLERGONOVINE MALEATE 0.2 MG/ML IJ SOLN
INTRAMUSCULAR | Status: AC
  Filled 2019-07-23: qty 1

## 2019-07-23 MED ORDER — BUPIVACAINE LIPOSOME 1.3 % IJ SUSP
20.0000 mL | INTRAMUSCULAR | Status: DC
  Filled 2019-07-23: qty 20

## 2019-07-23 MED ORDER — KETOROLAC TROMETHAMINE 30 MG/ML IJ SOLN
30.0000 mg | Freq: Four times a day (QID) | INTRAMUSCULAR | Status: AC
  Administered 2019-07-23 – 2019-07-24 (×3): 30 mg via INTRAVENOUS
  Filled 2019-07-23 (×3): qty 1

## 2019-07-23 MED ORDER — FERROUS SULFATE 325 (65 FE) MG PO TABS
325.0000 mg | ORAL_TABLET | Freq: Two times a day (BID) | ORAL | Status: DC
  Administered 2019-07-23 – 2019-07-25 (×4): 325 mg via ORAL
  Filled 2019-07-23 (×4): qty 1

## 2019-07-23 MED ORDER — MORPHINE SULFATE (PF) 0.5 MG/ML IJ SOLN
INTRAMUSCULAR | Status: DC | PRN
  Administered 2019-07-23: .1 mg via INTRATHECAL

## 2019-07-23 MED ORDER — INFLUENZA VAC SPLIT QUAD 0.5 ML IM SUSY: 0.5000 mL | PREFILLED_SYRINGE | INTRAMUSCULAR | Status: DC | PRN

## 2019-07-23 MED ORDER — KETOROLAC TROMETHAMINE 30 MG/ML IJ SOLN
INTRAMUSCULAR | Status: AC
  Filled 2019-07-23: qty 1

## 2019-07-23 MED ORDER — IBUPROFEN 600 MG PO TABS
600.0000 mg | ORAL_TABLET | Freq: Four times a day (QID) | ORAL | Status: DC
  Administered 2019-07-24 – 2019-07-25 (×5): 600 mg via ORAL
  Filled 2019-07-23 (×6): qty 1

## 2019-07-23 MED ORDER — DIPHENHYDRAMINE HCL 25 MG PO CAPS: 25.0000 mg | ORAL_CAPSULE | Freq: Four times a day (QID) | ORAL | Status: DC | PRN

## 2019-07-23 MED ORDER — KETAMINE HCL 50 MG/ML IJ SOLN
INTRAMUSCULAR | Status: AC
  Filled 2019-07-23: qty 10

## 2019-07-23 MED ORDER — OXYTOCIN 40 UNITS IN NORMAL SALINE INFUSION - SIMPLE MED
INTRAVENOUS | Status: DC | PRN
  Administered 2019-07-23: 850 mL via INTRAVENOUS

## 2019-07-23 MED ORDER — LACTATED RINGERS IV SOLN
INTRAVENOUS | Status: DC
  Administered 2019-07-23 (×2): via INTRAVENOUS

## 2019-07-23 MED ORDER — BUPIVACAINE IN DEXTROSE 0.75-8.25 % IT SOLN
INTRATHECAL | Status: DC | PRN
  Administered 2019-07-23: 1.6 mL via INTRATHECAL

## 2019-07-23 SURGICAL SUPPLY — 36 items
CANISTER SUCT 3000ML PPV (MISCELLANEOUS) ×3 IMPLANT
CLOSURE WOUND 1/2 X4 (GAUZE/BANDAGES/DRESSINGS) ×1
COVER WAND RF STERILE (DRAPES) IMPLANT
DERMABOND ADVANCED (GAUZE/BANDAGES/DRESSINGS) ×2
DERMABOND ADVANCED .7 DNX12 (GAUZE/BANDAGES/DRESSINGS) ×1 IMPLANT
DRESSING SURGICEL FIBRLLR 1X2 (HEMOSTASIS) ×1 IMPLANT
DRSG SURGICEL FIBRILLAR 1X2 (HEMOSTASIS) ×3
DRSG TELFA 3X8 NADH (GAUZE/BANDAGES/DRESSINGS) ×3 IMPLANT
ELECT CAUTERY BLADE 6.4 (BLADE) ×3 IMPLANT
ELECT REM PT RETURN 9FT ADLT (ELECTROSURGICAL) ×3
ELECTRODE REM PT RTRN 9FT ADLT (ELECTROSURGICAL) ×1 IMPLANT
GAUZE SPONGE 4X4 12PLY STRL (GAUZE/BANDAGES/DRESSINGS) ×3 IMPLANT
GLOVE PI ORTHOPRO 6.5 (GLOVE) ×4
GLOVE PI ORTHOPRO STRL 6.5 (GLOVE) ×2 IMPLANT
GLOVE SURG SYN 6.5 ES PF (GLOVE) ×9 IMPLANT
GOWN STRL REUS W/ TWL LRG LVL3 (GOWN DISPOSABLE) ×4 IMPLANT
GOWN STRL REUS W/TWL LRG LVL3 (GOWN DISPOSABLE) ×8
NS IRRIG 1000ML POUR BTL (IV SOLUTION) ×3 IMPLANT
PACK C SECTION AR (MISCELLANEOUS) ×3 IMPLANT
PAD OB MATERNITY 4.3X12.25 (PERSONAL CARE ITEMS) ×3 IMPLANT
PAD PREP 24X41 OB/GYN DISP (PERSONAL CARE ITEMS) ×3 IMPLANT
PENCIL SMOKE ULTRAEVAC 22 CON (MISCELLANEOUS) ×3 IMPLANT
SPONGE LAP 18X18 RF (DISPOSABLE) ×27 IMPLANT
STAPLER INSORB 30 2030 C-SECTI (MISCELLANEOUS) ×3 IMPLANT
STRAP SAFETY 5IN WIDE (MISCELLANEOUS) ×3 IMPLANT
STRIP CLOSURE SKIN 1/2X4 (GAUZE/BANDAGES/DRESSINGS) ×2 IMPLANT
SUT MNCRL 4-0 (SUTURE) ×2
SUT MNCRL 4-0 27XMFL (SUTURE) ×1
SUT PDS AB 1 TP1 96 (SUTURE) ×3 IMPLANT
SUT VIC AB 0 CT1 36 (SUTURE) ×6 IMPLANT
SUT VIC AB 2-0 CT1 27 (SUTURE) ×4
SUT VIC AB 2-0 CT1 TAPERPNT 27 (SUTURE) ×2 IMPLANT
SUT VIC AB 3-0 SH 27 (SUTURE) ×4
SUT VIC AB 3-0 SH 27X BRD (SUTURE) ×2 IMPLANT
SUTURE MNCRL 4-0 27XMF (SUTURE) ×1 IMPLANT
SWABSTK COMLB BENZOIN TINCTURE (MISCELLANEOUS) ×3 IMPLANT

## 2019-07-23 NOTE — Anesthesia Procedure Notes (Addendum)
Spinal  Patient location during procedure: OR Start time: 07/23/2019 8:27 AM End time: 07/23/2019 8:32 AM Staffing Anesthesiologist: Emmie Niemann, MD Resident/CRNA: Eben Burow, CRNA Performed: resident/CRNA  Preanesthetic Checklist Completed: patient identified, site marked, surgical consent, pre-op evaluation, timeout performed, IV checked, risks and benefits discussed and monitors and equipment checked Spinal Block Patient position: sitting Prep: ChloraPrep and site prepped and draped Patient monitoring: heart rate, continuous pulse ox and blood pressure Approach: midline Location: L3-4 Injection technique: single-shot Needle Needle type: Introducer and Pencil-Tip  Needle gauge: 24 G Needle length: 9 cm Assessment Sensory level: T4 Additional Notes No parasthesias, free flowing CSF

## 2019-07-23 NOTE — Transfer of Care (Signed)
Immediate Anesthesia Transfer of Care Note  Patient: Debbie Burns  Procedure(s) Performed: CESAREAN SECTION (N/A )  Patient Location: Mother/Baby  Anesthesia Type:Spinal  Level of Consciousness: awake, alert , oriented and patient cooperative  Airway & Oxygen Therapy: Patient Spontanous Breathing  Post-op Assessment: Report given to RN and Post -op Vital signs reviewed and stable  Post vital signs: Reviewed and stable  Last Vitals:  Vitals Value Taken Time  BP 114/96 07/23/19 1100  Temp 36.7 C 07/23/19 1100  Pulse 83 07/23/19 1100  Resp 18 07/23/19 1100  SpO2 100 % 07/23/19 1100    Last Pain:  Vitals:   07/23/19 1100  TempSrc: Oral  PainSc:          Complications: No apparent anesthesia complications

## 2019-07-23 NOTE — Lactation Note (Addendum)
This note was copied from a baby's chart. Lactation Consultation Note  Patient Name: Debbie Burns SWNIO'[id number] Date: 07/23/2019 Reason for consult: Initial assessment;1st time breastfeeding;Late-preterm 34-36.6wks;Multiple gestation Mom has breast implants with incision under breast, surgery 4 yrs ago, does not know whether above or below muscle implant placement   Maternal Data Formula Feeding for Exclusion: No DEBP set up after attempts and mom pumped in initiation mode with [employer], obtained approx. 7 cc colostrum, awaiting Debbie Stone  RN to contact ped to see if supplement needed and to feed EBM to babies  Feeding Feeding Type: Breast Fed Latched with much help to left breast but would only suck a few times , come off and tires quickly [facility] Score Latch: Repeated attempts needed to sustain latch, nipple held in mouth throughout feeding, stimulation needed to elicit sucking reflex.  Audible Swallowing: None  Type of Nipple: Everted at rest and after stimulation  Comfort (Breast/Nipple): Soft / non-tender(firm breasts from augmentation)  Hold (Positioning): Assistance needed to correctly position infant at breast and maintain latch.  LATCH Score: 6  Interventions Interventions: Breast feeding basics reviewed;Assisted with latch;Hand express;Support pillows;Position options;DEBP  Lactation Tools Discussed/Used Pump Review: Setup, frequency, and cleaning;Milk Storage Initiated by:: Chaya Jan RNC IBCLC Date initiated:: 07/23/19   Consult Status Consult Status: Follow-up Date: 07/24/19 Follow-up type: In-patient    Ferol Luz 07/23/2019, 5:06 PM

## 2019-07-23 NOTE — H&P (Addendum)
OB History & Physical   History of Present Illness:  Chief Complaint:   HPI:  Debbie Burns is a 43 y.o. G3P0 female at [redacted]w[redacted]d dated by LMP.  She presents to L&D for scheduled cesarean for mono-di twins.  Per  +FM, no CTX, no LOF, no VB  Pregnancy Issues: 1. Mono-di twins, no evidence of pathologic TTTS, 1% discordance 2. AMA - age 61  Maternal Medical History:  History reviewed. No pertinent past medical history.  Past Surgical History:  Procedure Laterality Date  . BREAST SURGERY  2015  . LIPOSUCTION  2019   Abdominal x2     No Known Allergies  Prior to Admission medications   Medication Sig Start Date End Date Taking? Authorizing Provider  aspirin EC 81 MG tablet Take 81 mg by mouth daily.   Yes [provider]  Prenatal Vit-Fe Fumarate-FA (PRENATAL MULTIVITAMIN) TABS tablet Take 1 tablet by mouth daily.    Yes [provider]     Pren[facility] care site: [facility]    Social History: She  reports that she has never smoked. She has never used smokeless tobacco. She reports previous alcohol use. She reports that she does not use drugs.  Family History: family history includes Cancer in her father and paternal grandmother; Hyperlipidemia in her daughter and mother.   Review of Systems: A full review of systems was performed and negative except as noted in the HPI.     Physical Exam:  Vital Signs: BP (!) 123/93   Pulse (!) 107   Temp 98.3 F (36.8 C) (Oral)   Resp 18   Ht 5\' 2"  (1.575 m)   Wt 73.5 kg   LMP 11/19/2018   BMI 29.63 kg/m  General: no acute distress.  HEENT: normocephalic, atraumatic Heart: regular rate & rhythm.  No murmurs/rubs/gallops Lungs: clear to auscultation bilaterally, normal respiratory effort Abdomen: soft, gravid, non-tender;  EFW: 5lb each Pelvic: deferred     Extremities: non-tender, symmetric, mild edema bilaterally.  DTRs: 2 Neurologic: Alert & oriented x 3.    Results for orders placed or  performed during the hospital encounter of 07/23/19 (from the past 24 hour(s))  Type and screen [facility]     Status: None (Preliminary result)   Collection Time: 07/23/19  6:42 AM  Result Value Ref Range   ABO/RH(D) PENDING    Antibody Screen PENDING    Sample Expiration      07/26/2019,2359 Performed at [facility], [address] [place], [place]   OB RESULT CONSOLE Group B Strep     Status: None   Collection Time: 07/23/19  7:00 AM  Result Value Ref Range   GBS Negative   OB RESULTS CONSOLE GC/Chlamydia     Status: None   Collection Time: 07/23/19  7:00 AM  Result Value Ref Range   Gonorrhea Negative    Chlamydia Negative   OB RESULTS CONSOLE RPR     Status: None   Collection Time: 07/23/19  7:00 AM  Result Value Ref Range   RPR Nonreactive   OB RESULTS CONSOLE HIV antibody     Status: None   Collection Time: 07/23/19  7:00 AM  Result Value Ref Range   HIV Non-reactive   OB RESULTS CONSOLE Rubella Antibody     Status: None   Collection Time: 07/23/19  7:00 AM  Result Value Ref Range   Rubella Immune   OB RESULTS CONSOLE Varicella zoster antibody, IgG  Status: None   Collection Time: 07/23/19  7:00 AM  Result Value Ref Range   Varicella Immune   OB RESULTS CONSOLE Hepatitis B surface antigen     Status: None   Collection Time: 07/23/19  7:00 AM  Result Value Ref Range   Hepatitis B Surface Ag Negative     Pertinent Results:  Prenatal Labs: Blood type/Rh O+  Antibody screen neg  Rubella Immune  Varicella Immune  RPR NR  HBsAg Neg  HIV NR  GC neg  Chlamydia neg  Genetic screening negative  1 hour GTT 111  3 hour GTT --  GBS negative   FHT: 145 x2 mod + accels no decels TOCO: q2-4 SVE: deferred    See Images/results for ultrasound throughout pregnancy  Assessment:  Debbie Burns is a 43 y.o. G3P0 female at [redacted]w[redacted]d with mono-di twins.   Plan:  1. Admit to Labor & Delivery 2. CBC, T&S, IVF,  NPO 3. Ancef to OR, tylenol suppository   4. Consents obtained/confirmed. 5. Continuous efm/toco while in preop 6. To OR when ready - per anesthesia  ----- Larey Days, MD Attending Obstetrician and Gynecologist [facility], Department of [facility]

## 2019-07-23 NOTE — Anesthesia Post-op Follow-up Note (Signed)
Anesthesia QCDR form completed.        

## 2019-07-23 NOTE — Op Note (Signed)
Cesarean Section Procedure Note  07/23/2019   Patient:  Debbie Burns  43 y.o. female at [redacted]w[redacted]d.  Patient's last menstrual period was 11/19/2018. Preoperative diagnosis:  Mono-Di Twins Postoperative diagnosis:  Mono-Di Twins  PROCEDURE:  Procedure(s): CESAREAN SECTION (N/A) Surgeon:  Surgeon(s) and Role:    * Burns, Debbie Fender, MD - Primary Assist: Debbie Burns, CNM Anesthesia:  spinal I/O: Total I/O In: 1200 [I.V.:1200] Out: 1000 [Urine:50; Blood:950] Specimens:  Cord Blood x2, placenta Complications: None Apparent Disposition:  VS stable to PACU  Findings:  1. normal uterus, tubes and ovaries bilaterally 2. Hemorrhage from vascular bed in omentum at the hepatic flexure of the transverse colon 3. Placenta: one bed, with amnion dividing, about 60/40 split.  40% bed had a central cord insertion.  3 vessels.  Several vessels spanning across the amnion division.  60% bed had a marginal insertion with unilateral vessel direction.  No difference in color or cotyledon distribution on maternal side.  At amnion division there was a bilateral infarction with a lone ectopic [place] of placental tissue, slightly bigger than a quarter.  Otherwise normal appearing.     Debbie, Burns [[id number]]  Live born female  Birth Weight: 5 lb 7.1 oz (2470 g) APGAR: 9, 9  Newborn Delivery   Birth date/time: 07/23/2019 09:06:00 Delivery type: C-Section, Low Transverse C-section categorization: Primary       Debbie Burns [[id number]]  Live born child  Birth Weight: 5 lb 4.7 oz (2400 g) APGAR: 9, 9  Newborn Delivery   Birth date/time: 07/23/2019 09:10:00 Delivery type: C-Section, Low Transverse C-section categorization: Primary       Debbie, Burns [[id number]]  Live born female  Birth Weight: 5 lb 4.7 oz (2400 g) APGAR: 9, 9  Newborn Delivery   Birth date/time: 07/23/2019 09:10:00 Delivery type: C-Section, Low Transverse C-section  categorization: Primary       Indication for procedure: 43 y.o. female at [redacted]w[redacted]d with mono-di twins who has been monitored for TTTS and has not shown any pathological changes.  Discordance 1% at last growth scan.    Procedure Details   The risks, benefits, complications, treatment options, and expected outcomes were discussed with the patient. Informed consent was obtained. The patient was taken to Operating Room, identified as Debbie Burns and the procedure verified as a cesarean delivery.   After administration of anesthesia, the patient was prepped and draped in the usual sterile manner, including a vaginal prep. A surgical time out was performed, with the pediatric team present. After confirming adequate anesthesia, a Pfannenstiel incision was made and carried down through the subcutaneous tissue to the fascia. Fascial incision was made and extended transversely. The fascia was separated from the underlying rectus tissue superiorly and inferiorly. The rectus muscles were divided in the midline. The peritoneum was identified and entered. Peritoneal incision was extended longitudinally.  A low transverse uterine incision was made. An amniotomy was made and fluid collected.  Delivered from cephalic presentation was a live born female. Delayed cord clamping was performed for 60 seconds while we sang happy birthday to baby Debbie Burns. The umbilical cord was doubly clamped and cut, and the baby was handed off to the awaitng pediatrician.  Cord blood was obtained for evaluation. Likewise, an amniotomy was made in the second sac, and fluid collected.  Delivered from cephalic presentation was a live born female. Delayed cord clamping was performed for 60 seconds while we sang happy birthday to baby Debbie Burns. The umbilical cord was doubly clamped  and cut, and the baby was handed off to the awaitng pediatrician.  Cord blood was obtained for evaluation. The placenta was removed intact and appeared normal, It  was examined after the conclusion of the procedure with notes above. The uterus was delivered from the abdominal cavity and cleared of clots, membranes, and debris. The uterus, tubes and ovaries appeared normal. The uterine incision was closed with running locking sutures of 0 Vicryl, and then a second, imbricating stitch was placed. Hemostasis was observed. The abdominal cavity was evacuated of extraneous fluid, and the fluid was very bloody.  Some small clots were removed, and irrigation performed.  The remaining fluid extracted was light pink. The uterus was returned to the abdominal cavity and again the incision was inspected for hemostasis, which was confirmed, however more bloody fluid began to well up on the right.  The uterine incision was re-inspected and still hemostatic.  The fluid appeared to be coming from superior to the uterus.  The uterus was again externalized and much more bloody fluid was seen in the posterior culdesac.  The omentum, small bowel, cecum, appendix, sigmoid and transverse colon were evaluated.  There was a vascular bed at the transverse colon / omentum interface at the hepatic flexure that was hemorrhaging. The omentum was divided to identify the transverse colon serosa.  The vascular area was denuded and bleeding from many places.  2-0 vicryl was used in interrupted stiches to ligate the feeding vessels.  Hemostasis was then noted.  The remainder of the omentum, transverse colon and mesentary was evaluated, and hemostatic. Intercede was placed along the uterine incision and along the serosa midline. The rectus muscles and fascia were evaluated for hemostasis.  Fibular was placed along the pyramidalis.  The fascia was then reapproximated with running suture of vicryl. 90cc of Long- and short-acting bupivicaine was injected circumferentially into the fascia.  After a change of gloves, the subcutaneous tissue was irrigated and reapproximated with 3-0 vicryl. The skin was closed with  absorbable stables, and 10cc of long- and short-acting bupivacaine injected into the skin and subcutaneous tissues.  The incision was covered with surgical glue. An abdominal binder was placed.    Instrument, sponge, and needle counts were correct prior the abdominal closure and at the conclusion of the case.   I was present and performed this procedure in its entirety.  VTE: SCDs Perioperative antibiotics: Ancef 2g  ----- Larey Days, MD Attending Obstetrician and Gynecologist [facility], Department of [facility]

## 2019-07-23 NOTE — Anesthesia Preprocedure Evaluation (Signed)
Anesthesia Evaluation  Patient identified by MRN, date of birth, ID band Patient awake    Reviewed: Allergy & Precautions, NPO status , Patient's Chart, lab work & pertinent test results  History of Anesthesia Complications Negative for: history of anesthetic complications  Airway Mallampati: II  TM Distance: >3 FB Neck ROM: Full    Dental no notable dental hx.    Pulmonary neg pulmonary ROS, neg sleep apnea, neg COPD,    breath sounds clear to auscultation- rhonchi (-) wheezing      Cardiovascular Exercise Tolerance: Good (-) hypertension(-) CAD and (-) Past MI  Rhythm:Regular Rate:Normal - Systolic murmurs and - Diastolic murmurs    Neuro/Psych negative neurological ROS  negative psych ROS   GI/Hepatic negative GI ROS, Neg liver ROS,   Endo/Other  negative endocrine ROSneg diabetes  Renal/GU negative Renal ROS     Musculoskeletal negative musculoskeletal ROS (+)   Abdominal (+) - obese, Gravid abdomen   Peds  Hematology negative hematology ROS (+)   Anesthesia Other Findings Twin pregnancy  Reproductive/Obstetrics (+) Pregnancy                             Lab Results  Component Value Date   WBC 9.0 07/15/2019   HGB 11.8 (L) 07/15/2019   HCT 35.4 (L) 07/15/2019   MCV 94.1 07/15/2019   PLT 176 07/15/2019    Anesthesia Physical Anesthesia Plan  ASA: II  Anesthesia Plan: Spinal   Post-op Pain Management:    Induction:   PONV Risk Score and Plan: 2 and Ondansetron and Treatment may vary due to age or medical condition  Airway Management Planned: Natural Airway  Additional Equipment:   Intra-op Plan:   Post-operative Plan:   Informed Consent: I have reviewed the patients History and Physical, chart, labs and discussed the procedure including the risks, benefits and alternatives for the proposed anesthesia with the patient or authorized representative who has indicated  his/her understanding and acceptance.     Dental advisory given  Plan Discussed with: CRNA and Anesthesiologist  Anesthesia Plan Comments:         Anesthesia Quick Evaluation

## 2019-07-24 LAB — CBC
HCT: 25.2 % — ABNORMAL LOW (ref 36.0–46.0)
Hemoglobin: 8.5 g/dL — ABNORMAL LOW (ref 12.0–15.0)
MCH: 31.8 pg (ref 26.0–34.0)
MCHC: 33.7 g/dL (ref 30.0–36.0)
MCV: 94.4 fL (ref 80.0–100.0)
Platelets: 122 10*3/uL — ABNORMAL LOW (ref 150–400)
RBC: 2.67 MIL/uL — ABNORMAL LOW (ref 3.87–5.11)
RDW: 14.7 % (ref 11.5–15.5)
WBC: 8.9 10*3/uL (ref 4.0–10.5)
nRBC: 0 % (ref 0.0–0.2)

## 2019-07-24 MED ORDER — VITAMIN C 500 MG PO TABS
500.0000 mg | ORAL_TABLET | Freq: Two times a day (BID) | ORAL | Status: DC
  Administered 2019-07-24 – 2019-07-25 (×3): 500 mg via ORAL
  Filled 2019-07-24 (×3): qty 1

## 2019-07-24 MED ORDER — SODIUM CHLORIDE 0.9 % IV SOLN
200.0000 mg | Freq: Once | INTRAVENOUS | Status: AC
  Administered 2019-07-24: 200 mg via INTRAVENOUS
  Filled 2019-07-24: qty 10

## 2019-07-24 NOTE — Plan of Care (Signed)
Patient's foley discontinued at 0230 and patient denies need to void at present; incision has been oosing tonight (see assessment); new pressure dressing applied; bonding well with infants; pumping and bottle feeding; infants tire easily; VVS; tolerating clear liquids; husband at bedside and attentive.

## 2019-07-24 NOTE — Progress Notes (Signed)
Patient has received 1466 ml IV Lactated Ringers this shift (LR did not show on Intake and Output flowsheet).

## 2019-07-24 NOTE — Anesthesia Post-op Follow-up Note (Signed)
  Anesthesia Pain Follow-up Note  Patient: Debbie Burns  Day #: 1  Date of Follow-up: 07/24/2019 Time: 1:54 PM  Last Vitals:  Vitals:   07/24/19 0419 07/24/19 0820  BP: 102/68 98/68  Pulse: 78 81  Resp: 20 18  Temp: 36.8 C 36.9 C  SpO2: 98% 100%    Level of Consciousness: alert  Pain: none   Side Effects:None  Catheter Site Exam:clean, dry     Plan: D/C from anesthesia care at surgeon's request  [name]

## 2019-07-24 NOTE — Anesthesia Postprocedure Evaluation (Signed)
Anesthesia Post Note  Patient: Debbie Burns  Procedure(s) Performed: CESAREAN SECTION (N/A )  Patient location during evaluation: Mother Baby Anesthesia Type: Spinal Level of consciousness: oriented and awake and alert Pain management: pain level controlled Vital Signs Assessment: post-procedure vital signs reviewed and stable Respiratory status: spontaneous breathing and respiratory function stable Cardiovascular status: blood pressure returned to baseline and stable Postop Assessment: no headache, no backache, no apparent nausea or vomiting and able to ambulate Anesthetic complications: no     Last Vitals:  Vitals:   07/24/19 0419 07/24/19 0820  BP: 102/68 98/68  Pulse: 78 81  Resp: 20 18  Temp: 36.8 C 36.9 C  SpO2: 98% 100%    Last Pain:  Vitals:   07/24/19 0820  TempSrc: Oral  PainSc:                  [name]

## 2019-07-24 NOTE — Progress Notes (Signed)
Post Op Day 1  Subjective: Doing well, no concerns. Ambulating without difficulty, pain managed with PO meds, tolerating regular diet, and voiding without difficulty.   No fever/chills, chest pain, shortness of breath, nausea/vomiting, or leg pain. No nipple or breast pain.   Objective: BP 98/68 (BP Location: Left Arm)   Pulse 81   Temp 98.4 F (36.9 C) (Oral)   Resp 18   Ht 5\' 2"  (1.575 m)   Wt 73.5 kg   LMP 11/19/2018   SpO2 100% Comment: Room Air  Breastfeeding Unknown   BMI 29.63 kg/m    Physical Exam:  General: alert, cooperative, appears stated age and no distress Breasts: soft/nontender CV: RRR Pulm: nl effort, CTABL Abdomen: soft, non-tender, active bowel sounds Uterine Fundus: firm Incision: healing well, no significant drainage, no dehiscence, no significant erythema Lochia: appropriate DVT Evaluation: No evidence of DVT seen on physical exam. No cords or calf tenderness. No significant calf/ankle edema.  Recent Labs    07/23/19 1424 07/24/19 0535  HGB 11.3* 8.5*  HCT 32.8* 25.2*  WBC 15.6* 8.9  PLT 144* 122*    Assessment/Plan: 43 y.o. [id number] postop day # 1  -Continue routine postpartum care -Lactation consult PRN for breastfeeding  -Patient unsure regarding contraception -Acute blood loss anemia - hemodynamically stable and asymptomatic; start PO ferrous sulfate BID with stool softeners and vitamin C -Immunization status: Needs flu prior to discharge  Disposition: Continue inpatient postpartum care    LOS: 1 day   Lisette Grinder, CNM 07/24/2019, 10:12 AM   ----- Lisette Grinder Certified Nurse Midwife [facility]

## 2019-07-24 NOTE — Discharge Summary (Signed)
Obstetrical Discharge Summary  Patient Name: Debbie Burns DOB: 1976-04-20 MRN: [id number]  Date of Admission: 07/23/2019 Date of Delivery: 07/23/2019 Delivered by: Ranae Plumber, MD Date of Discharge: 07/25/2019  Primary OB: [facility]  [id number] last menstrual period was 11/19/2018. EDC Estimated Date of Delivery: 08/16/19 Gestational Age at Delivery: [redacted]w[redacted]d   Antepartum complications:   Pregnancy Issues: 1. Mono-di twins, no evidence of pathologic TTTS, 1% discordance 2. AMA - age 43  Admitting Diagnosis:  Mono-di twins, scheduled cesarean delivery Secondary Diagnosis: Patient Active Problem List   Diagnosis Date Noted  . Monochorionic diamniotic twin gestation 07/23/2019  . Advanced maternal age in multigravida, first trimester     Augmentation: n/a Complications: None Intrapartum complications/course: Admitted for scheduled cesarean.  Delivery Type: primary cesarean section, low transverse incision Anesthesia: spinal Placenta: expressed Laceration: n/a Episiotomy: none Newborn Data:   Ysabella, Babiarz [[id number]]  Live born female  Birth Weight: 5 lb 7.1 oz (2470 g) APGAR: 9, 9  Newborn Delivery   Birth date/time: 07/23/2019 09:06:00 Delivery type: C-Section, Low Transverse C-section categorization: Primary       Burns, PendingBaby [[id number]]  Live born child  Birth Weight: 5 lb 4.7 oz (2400 g) APGAR: 9, 9  Newborn Delivery   Birth date/time: 07/23/2019 09:10:00 Delivery type: C-Section, Low Transverse C-section categorization: Primary       Sawsan, Riggio [[id number]]  Live born female  Birth Weight: 5 lb 4.7 oz (2400 g) APGAR: 9, 9  Newborn Delivery   Birth date/time: 07/23/2019 09:10:00 Delivery type: C-Section, Low Transverse C-section categorization: Primary        Postpartum Procedures: IV Venfer 200mg  x1  Post partum course:  Patient had a postpartum course complicated by acute blood  loss anemia, remained asymptomatic.  She was given IV iron.  By time of discharge on POD#2, her pain was controlled on oral pain medications; she had appropriate lochia and was ambulating, voiding without difficulty, tolerating regular diet and passing flatus.   She was deemed stable for discharge to home.   Discharge Physical Exam:  BP 117/70 (BP Location: Right Arm)   Pulse 82   Temp 97.6 F (36.4 C) (Oral)   Resp 18   Ht 5\' 2"  (1.575 m)   Wt 73.5 kg   LMP 11/19/2018   SpO2 97%   Breastfeeding Unknown   BMI 29.63 kg/m   General: NAD CV: RRR Pulm: CTABL, nl effort ABD: s/nd/nt, fundus firm and below the umbilicus Lochia: moderate Incision: c/d/I DVT Evaluation: LE non-ttp, no evidence of DVT on exam.  Hemoglobin  Date Value Ref Range Status  07/25/2019 9.0 (L) 12.0 - 15.0 g/dL Final   HCT  Date Value Ref Range Status  07/25/2019 27.2 (L) 36.0 - 46.0 % Final     Disposition: stable, discharge to home. Baby Feeding: breastmilk and formula Baby Disposition: home with mom  Rh Immune globulin given: n/a Rubella vaccine given: n/a Flu vaccine given in AP or PP setting: offered  Contraception: plans IUD  Prenatal Labs:   Blood type/Rh O+  Antibody screen neg  Rubella Immune  Varicella Immune  RPR NR  HBsAg Neg  HIV NR  GC neg  Chlamydia neg  Genetic screening negative  1 hour GTT 111  3 hour GTT --  GBS negative     Plan:  Debbie Burns was discharged to home in good condition. Follow-up appointment with Dr. 07/27/2019 in 2 weeks  Discharge Medications: Allergies as of 07/25/2019   No  Known Allergies     Medication List    STOP taking these medications   aspirin EC 81 MG tablet     TAKE these medications   acetaminophen 500 MG tablet Commonly known as: TYLENOL Take 2 tablets (1,000 mg total) by mouth every 6 (six) hours as needed for mild pain or moderate pain.   ferrous sulfate 325 (65 FE) MG tablet Take 1 tablet (325 mg total) by  mouth 2 (two) times daily with a meal.   ibuprofen 600 MG tablet Commonly known as: ADVIL Take 1 tablet (600 mg total) by mouth every 6 (six) hours as needed for mild pain, moderate pain or cramping.   oxyCODONE 5 MG immediate release tablet Commonly known as: Oxy IR/ROXICODONE Take 1 tablet (5 mg total) by mouth every 4 (four) hours as needed for moderate pain.   prenatal multivitamin Tabs tablet Take 1 tablet by mouth daily.       Follow-up Information    Ward, Honor Loh, MD. Schedule an appointment as soon as possible for a visit in 2 week(s).   Specialty: Obstetrics and Gynecology Why: For post-op check Contact information: [place] [address] [phone]           Signed: Lisette Grinder, CNM 07/25/2019 11:42 AM

## 2019-07-25 LAB — CBC
HCT: 27.2 % — ABNORMAL LOW (ref 36.0–46.0)
Hemoglobin: 9 g/dL — ABNORMAL LOW (ref 12.0–15.0)
MCH: 31.5 pg (ref 26.0–34.0)
MCHC: 33.1 g/dL (ref 30.0–36.0)
MCV: 95.1 fL (ref 80.0–100.0)
Platelets: 142 10*3/uL — ABNORMAL LOW (ref 150–400)
RBC: 2.86 MIL/uL — ABNORMAL LOW (ref 3.87–5.11)
RDW: 14.7 % (ref 11.5–15.5)
WBC: 9.6 10*3/uL (ref 4.0–10.5)
nRBC: 0.3 % — ABNORMAL HIGH (ref 0.0–0.2)

## 2019-07-25 MED ORDER — OXYCODONE HCL 5 MG PO TABS: 5.0000 mg | ORAL_TABLET | ORAL | 0 refills | Status: DC | PRN

## 2019-07-25 MED ORDER — IBUPROFEN 600 MG PO TABS: 600.0000 mg | ORAL_TABLET | Freq: Four times a day (QID) | ORAL | 0 refills | Status: DC | PRN

## 2019-07-25 MED ORDER — FERROUS SULFATE 325 (65 FE) MG PO TABS: 325.0000 mg | ORAL_TABLET | Freq: Two times a day (BID) | ORAL | 0 refills | Status: DC

## 2019-07-25 MED ORDER — ACETAMINOPHEN 500 MG PO TABS: 1000.0000 mg | ORAL_TABLET | Freq: Four times a day (QID) | ORAL | 0 refills | Status: DC | PRN

## 2019-07-25 NOTE — Discharge Instructions (Signed)
Discharge instructions:   Call office if you have any of the following: headache, visual changes, fever >101.0 F, chills, shortness of breath, breast concerns, excessive vaginal bleeding, incision drainage or problems, leg pain or redness, depression or any other concerns.   Activity: Do not lift > 10 lbs for 6 weeks.  No intercourse or tampons for 6 weeks.  No driving for 1-2 weeks.   Call your doctor for increased pain or vaginal bleeding, temperature above 101.0, depression, or concerns.  No strenuous activity or heavy lifting for 6 weeks.  No intercourse, tampons, douching, or enemas for 6 weeks.  No tub baths-showers only.  No driving for 2 weeks or while taking pain medications.  Continue prenatal vitamin and iron.  Increase calories and fluids while breastfeeding.  Pain control should be managed with taking ibuprofen and tylenol together around the clock for the first week.  Every 6 hours, 600mg  ibuprofen and 1000mg  tylenol.  The narcotics you can take for severe pain - they just trick your brain into thinking it's not in pain.  You may have a slight fever when your milk comes in, but it should go away on its own.  If it does not, and rises above 101.0 please call the doctor.  For concerns about your babies, please call your pediatrician For breastfeeding concerns, the lactation consultant can be reached at [phone]    After Your Delivery Discharge Instructions   Postpartum: Care Instructions  After childbirth (postpartum period), your body goes through many changes. Some of these changes happen over several weeks. In the hours after delivery, your body will begin to recover from childbirth while it prepares to breastfeed your newborn. You may feel emotional during this time. Your hormones can shift your mood without warning for no clear reason.  In the first couple of weeks after childbirth, many women have emotions that change from happy to sad. You may find it hard to sleep.  You may cry a lot. This is called the "baby blues." These overwhelming emotions often go away within a couple of days or weeks. But it's important to discuss your feelings with your doctor.  You should call your care provider if you have unrelieved feelings of:  Inability to cope  Sadness  Anxiety  Lack of interest in baby  Insomnia  Crying  It is easy to get too tired and overwhelmed during the first weeks after childbirth. Don't try to do too much. Get rest whenever you can, accept help from others, and eat well and drink plenty of fluids.  About 4 to 6 weeks after your baby's birth, you will have a follow-up visit with your care provider. This visit is your time to talk to your provider about anything you are concerned or curious about.  Follow-up care is a key part of your treatment and safety. Be sure to make and go to all appointments, and call your doctor if you are having problems. It's also a good idea to know your test results and keep a list of the medicines you take.  How can you care for yourself at home?  Sleep or rest when your baby sleeps.  Get help with household chores from family or friends, if you can. Do not try to do it all yourself.  If you have hemorrhoids or swelling or pain around the opening of your vagina, try using cold and heat. You can put ice or a cold pack on the area for 10 to 20 minutes at  a time. Put a thin cloth between the ice and your skin. Also try sitting in a few inches of warm water (sitz bath) 3 times a day and after bowel movements.  Take pain medicines exactly as directed.  If the provider gave you a prescription medicine for pain, take it as prescribed.  If you do not have a prescription and need something over the counter, you can take:  Ibuprofen (Motrin, Advil) up to 600mg  every 6 hours as needed for pain  Acetaminophen (Tylenol) up to 650mg  every 4 hours as needed for pain  Some people find it helpful to alternate between  these two medications.   No driving for 1-2 weeks or while taking pain medications.   Eat more fiber to avoid constipation. Include foods such as whole-grain breads and cereals, raw vegetables, raw and dried fruits, and beans.  Drink plenty of fluids, enough so that your urine is light yellow or clear like water. If you have kidney, heart, or liver disease and have to limit fluids, talk with your doctor before you increase the amount of fluids you drink.  Do not put anything in the vagina for 6 weeks. This means no sex, no tampons, no douching, and no enemas.  If you have stitches, keep the area clean by pouring or spraying warm water over the area outside your vagina and anus after you use the toilet.  No strenuous activity or heavy lifting for 6 weeks   No tub baths; showers only  Continue prenatal vitamin and iron.  If breastfeeding:  Increase calories and fluids while breastfeeding.  You may have a slight fever when your milk comes in, but it should go away on its own. If it does not, and rises above 101.0 please call the doctor.  For breastfeeding concerns, the lactation consultant can be reached at [phone].  For concerns about your baby, please call your pediatrician.   Keep a list of questions to bring to your postpartum visit. Your questions might be about:  Changes in your breasts, such as lumps or soreness.  When to expect your menstrual period to start again.  What form of birth control is best for you.  Weight you have put on during the pregnancy.  Exercise options.  What foods and drinks are best for you, especially if you are breastfeeding.  Problems you might be having with breastfeeding.  When you can have sex. Some women may want to talk about lubricants for the vagina.  Any feelings of sadness or restlessness that you are having.   When should you call for help?  Call 911 anytime you think you may need emergency care. For example, call  if:  You have thoughts of harming yourself, your baby, or another person.  You passed out (lost consciousness).  Call the office at [phone] or seek immediate medical care if:  If you have heavy bleeding such that you are soaking 1 pad in an hour for 2 hours  You are dizzy or lightheaded, or you feel like you may faint.  You have a fever; a temperature of 101.0 F or greater  Chills  Difficulty urinating  Headache unrelieved by "pain meds"   Visual changes  Pain in the right side of your belly near your ribs  Breasts reddened, hard, hot to the touch or any other breast concerns  Nipple discharge which is foul-smelling or contains pus   Increased pain at the site of the surgical incision   Incision drainage or  problems  New pain unrelieved with recommended over-the-counter dosages  Difficulty breathing with or without chest pain   New leg pain, swelling, or redness, especially if it is only on one leg  Any other concerns  Watch closely for changes in your health, and be sure to contact your provider if:  You have new or worse vaginal discharge.  You feel sad or depressed.  You are having problems with your breasts or breastfeeding.

## 2019-07-25 NOTE — Progress Notes (Signed)
Reviewed D/C instructions with pt and family. Pt verbalized understanding of teaching. Discharged to home via W/C. Pt to schedule f/u appt.  

## 2019-07-25 NOTE — Lactation Note (Signed)
This note was copied from a baby's chart. Lactation Consultation Note  Patient Name: Debbie Burns [id number] Date: 07/25/2019 Reason for consult: Follow-up assessment;Late-preterm 34-36.6wks;Breast augmentation;Other (Comment)(Twin A)  Assisted mom with comfortable position with pillow support in sidelying position on left breast.   [name] has difficulty opening wide enough to achieve deep latch.  Lips will be pierced and gums clinched.  It always takes several attempts to get sustainable latch.  Once we get wide open mouth and flanged lips, he maintained the latch with strong, rhythmic sucking for 20 minutes with audible swallows.  Once sucks and swallows had slowed and mom reports tugs at the breast were not as strong, she broke the suction.  [name] appeared satiated and was placed back in crib.  Mom wanted to try pumping, but was not able to express but drops.  Mom had breast augmentation 4 years ago.  Could easily hand express colostrum getting 4 ml in short period.  [name] was rooting.  Mom turned to right side and after several attempts was able to achieve deep enough latch for [name] to begin strong sucks.  Mom and twins are to be discharged home later today.  Had discussed loaning mom a [employer] DEBP for a week.  Parents declined, but wanted to know how to use manual pump. Demonstrated to FOB earlier how set up manual pump for single and double pumping.  Lactation community resources reviewed including virtual support group, out patient consults and contact numbers encouraging mom to call with any questions, concerns or assistance. Maternal Data Formula Feeding for Exclusion: No Has patient been taught Hand Expression?: Yes Does the patient have breastfeeding experience prior to this delivery?: Yes  Feeding Feeding Type: Breast Fed  LATCH Score Latch: Repeated attempts needed to sustain latch, nipple held in mouth throughout feeding, stimulation needed to elicit sucking  reflex.  Audible Swallowing: A few with stimulation  Type of Nipple: Everted at rest and after stimulation  Comfort (Breast/Nipple): Filling, red/small blisters or bruises, mild/mod discomfort  Hold (Positioning): Assistance needed to correctly position infant at breast and maintain latch.  LATCH Score: 6  Interventions Interventions: Assisted with latch;Breast massage;Hand express;Reverse pressure;Breast compression;Adjust position;Support pillows;Position options;Comfort gels  Lactation Tools Discussed/Used Tools: Comfort gels Breast pump type: Double-Electric Breast Pump;Manual WIC Program: Yes Pump Review: Setup, frequency, and cleaning;Milk Storage;Other (comment) Initiated by:: S.Jeanine Caven,RN,BSN,IBCLC Date initiated:: 07/25/19   Consult Status Consult Status: Follow-up Date: 07/25/19 Follow-up type: Call as needed    [name] 07/25/2019, 3:18 PM

## 2019-07-26 ENCOUNTER — Encounter: Payer: Self-pay | Admitting: Obstetrics & Gynecology

## 2019-07-26 LAB — SURGICAL PATHOLOGY

## 2020-03-01 IMAGING — US US MFM OB LIMITED
1 series · 12 of 28 positions shown · non-contrast
Comparison: none

PATIENT INFO:

             ALIYA
PERFORMED BY:
                   Sonographer
SERVICE(S) PROVIDED:
  US MFM OB LIMITED                                    76815.01
 ----------------------------------------------------------------------
INDICATIONS:
  22 weeks gestation of pregnancy
FETAL EVALUATION (FETUS A):
 Num Of Fetuses:         2
 Fetal Heart Rate(bpm):  173
 Presentation:           Cephalic
 Placenta:               Anterior
                             Largest Pocket(cm)
BIOMETRY (FETUS A):
 BPD:      53.5  mm     G. Age:  22w 2d         40  %    CI:        75.46   %    70 - 86
                                                         FL/HC:      19.8   %    18.4 -
 HC:      195.3  mm     G. Age:  21w 5d         14  %    HC/AC:      1.11        1.06 -
 AC:      175.7  mm     G. Age:  22w 3d         44  %    FL/BPD:     72.3   %    71 - 87
 FL:       38.7  mm     G. Age:  22w 3d         39  %    FL/AC:      22.0   %    20 - 24
 HUM:      36.4  mm     G. Age:  22w 5d         51  %
 Est. FW:     499  gm      1 lb 2 oz     42  %     FW Discordancy      0 \ 1 %
GESTATIONAL AGE (FETUS A):
 LMP:           20w 5d        Date:  11/21/18                 EDD:   08/28/19
 U/S Today:     22w 2d                                        EDD:   08/17/19
 Best:          22w 3d     Det. By:  Early Ultrasound         EDD:   08/16/19
                                     (02/17/19)
ANATOMY (FETUS A):
 Cavum:                 Visualized             Stomach:                Seen
                        previously
 Ventricles:            Normal appearance      Abdominal Wall:         Visualized
                                                                       previously
 Cerebellum:            Visualized             Cord Vessels:           3 vessels,
                        previously                                     visualized previously
 Posterior Fossa:       Visualized             Kidneys:                Normal appearance
 Face:                  Orbits visualized      Bladder:                Seen
 Lips:                  Visualized             Spine:                  Visualized
                        previously                                     previously
 Heart:                 4-Chamber view         Upper Extremities:      Visualized
                        appears normal                                 previously
 RVOT:                  Normal appearance      Lower Extremities:      Visualized
 LVOT:                  Normal appearance
FETAL EVALUATION (FETUS B):
 Fetal Heart Rate(bpm):  150
BIOMETRY (FETUS B):
 BPD:        53  mm     G. Age:  22w 1d         34  %    CI:         74.9   %    70 - 86
                                                         FL/HC:      19.6   %    18.4 -
 HC:      194.3  mm     G. Age:  21w 4d         12  %    HC/AC:      1.10        1.06 -
 AC:       177   mm     G. Age:  22w 4d         48  %    FL/BPD:     71.7   %    71 - 87
 FL:         38  mm     G. Age:  22w 1d         31  %    FL/AC:      21.5   %    20 - 24
 HUM:      35.7  mm     G. Age:  22w 3d         44  %
 Est. FW:     493  gm      1 lb 1 oz     39  %     FW Discordancy         1  %
GESTATIONAL AGE (FETUS B):
 U/S Today:     22w 1d                                        EDD:   08/18/19
ANATOMY (FETUS B):
CERVIX UTERUS ADNEXA:
 Cervix
 Length:           6.45  cm.

[Series 1: us mfm ob limited · 0.26mm/px · 12 of 91 slices shown]
[im 4/91]
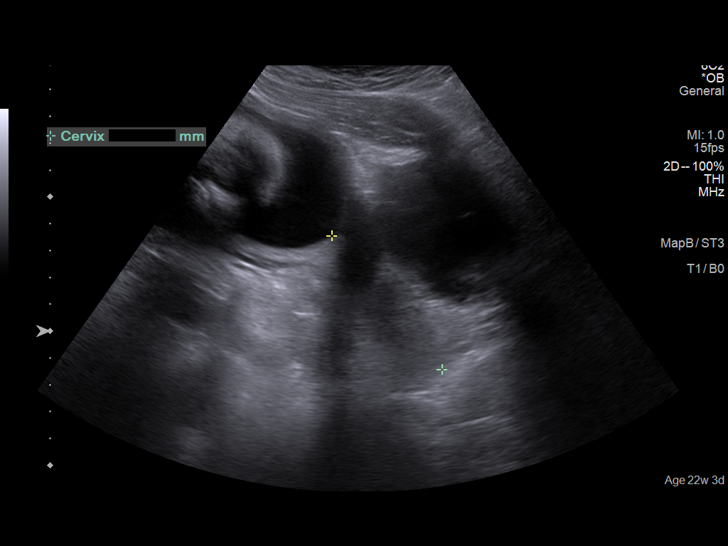
[im 11/91]
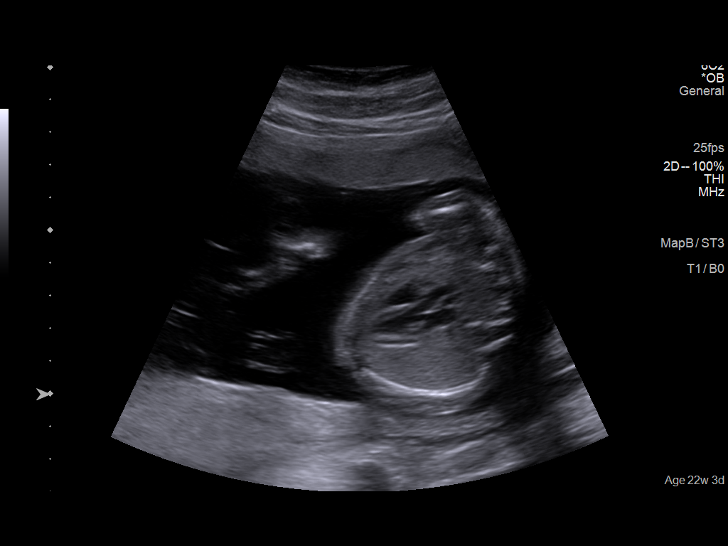
[im 17/91]
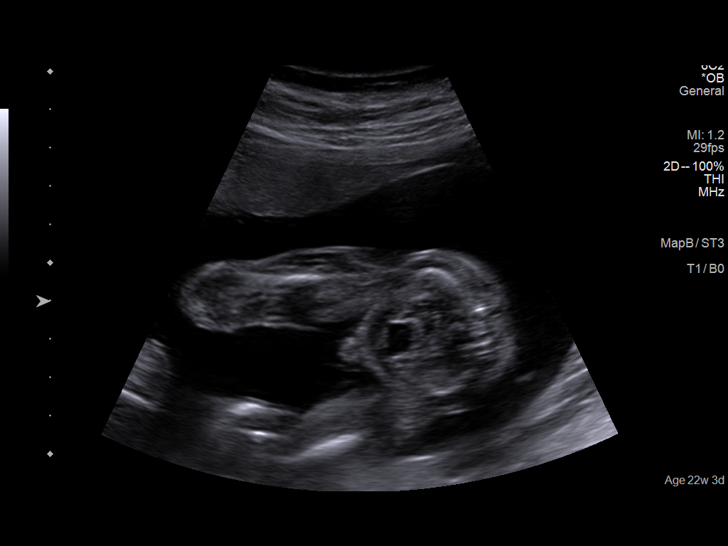
[im 27/91]
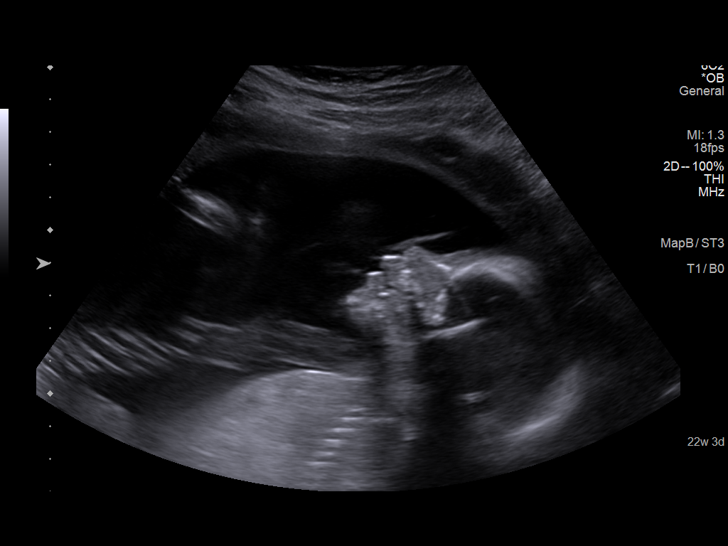
[im 34/91]
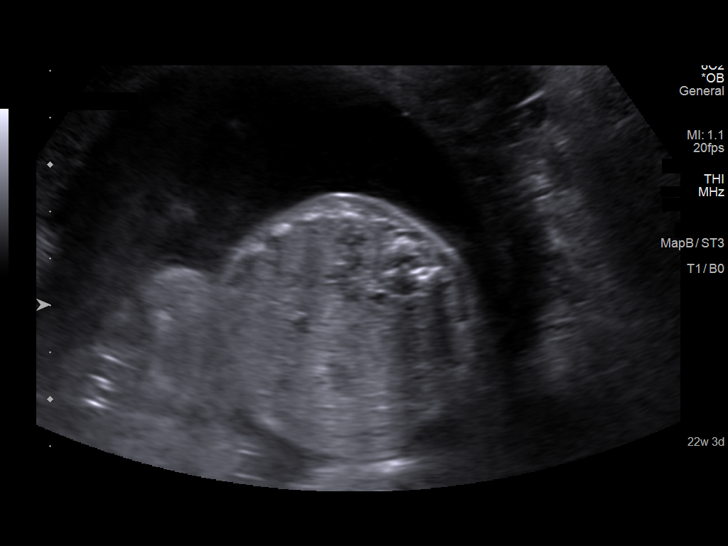
[im 41/91]
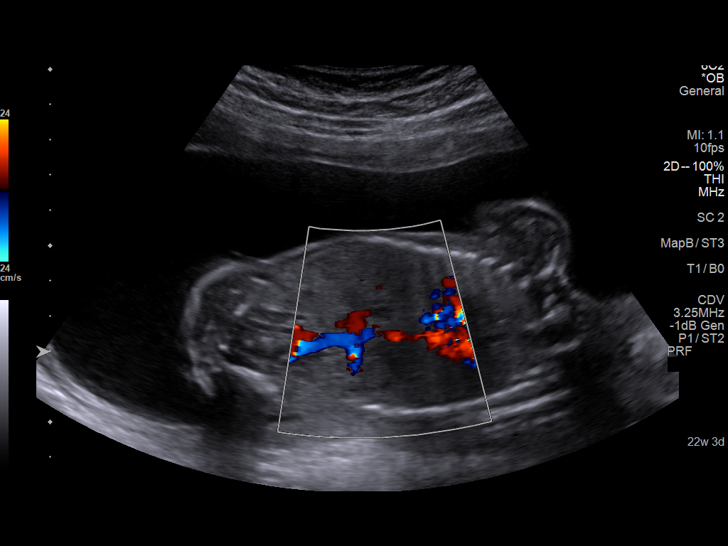
[im 51/91]
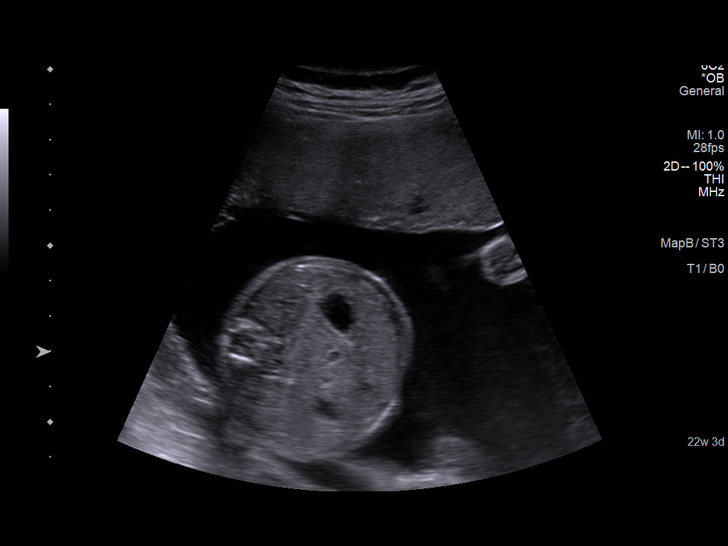
[im 57/91]
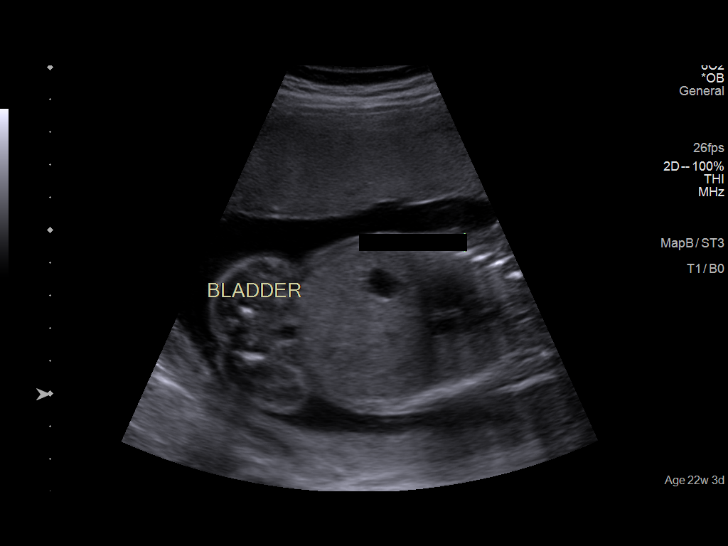
[im 64/91]
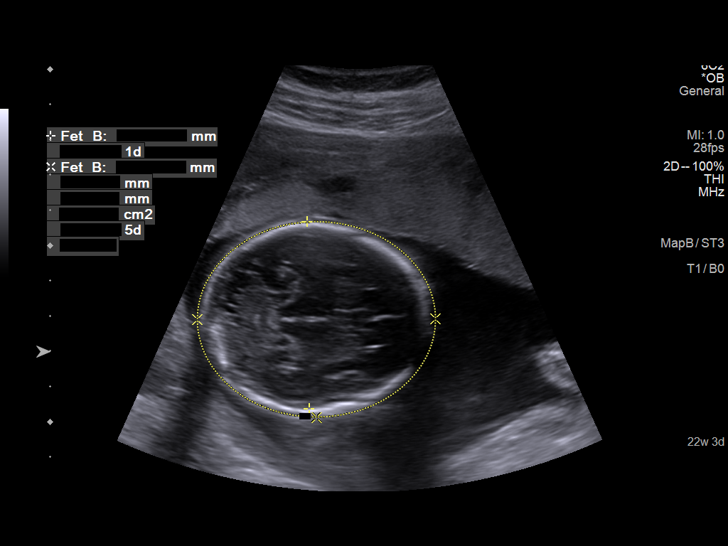
[im 74/91]
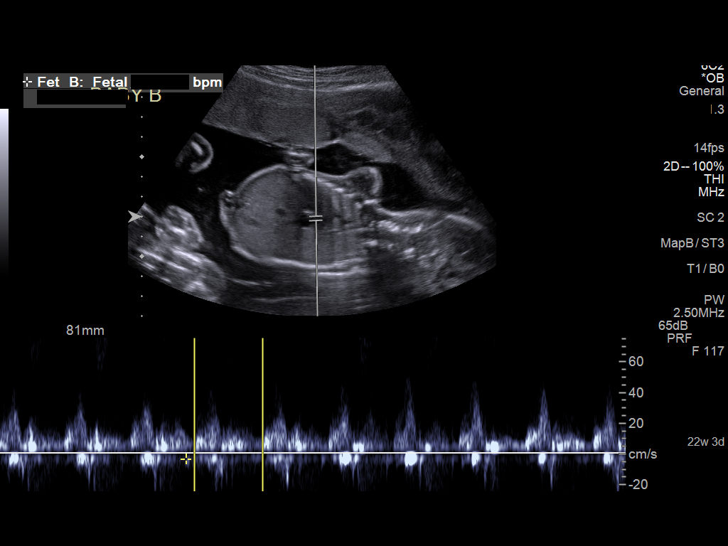
[im 81/91]
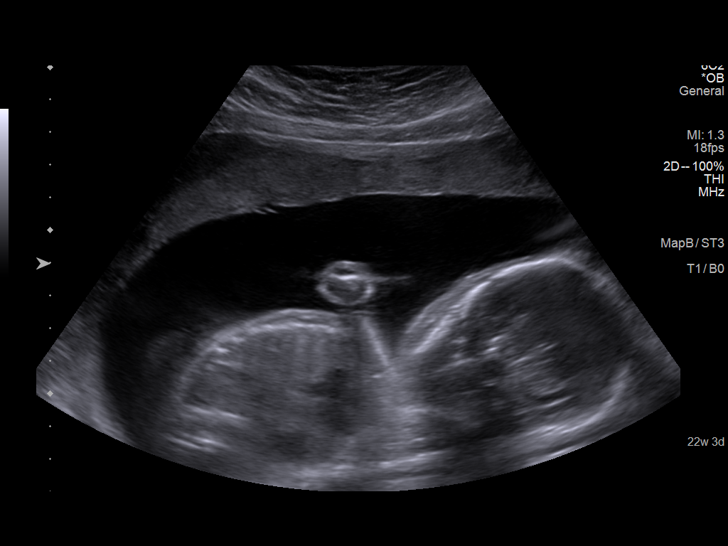
[im 87/91]
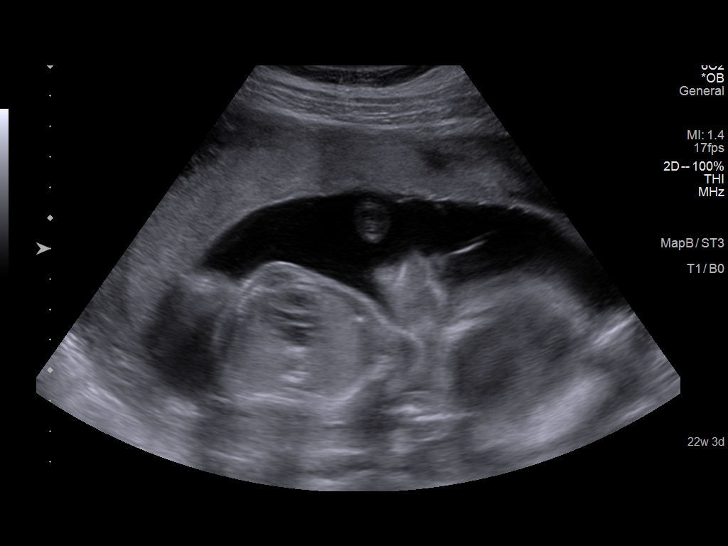

[12 of 28 positions shown; findings below may reference images not displayed]

IMPRESSION: Thank you for referring your patient for TTT surveillance
 ultrasound for twin growth evaluation.  Dating is by earliest
 available scan performed at [HOSPITAL] on 02/17/19 at 14w 2 days.

 A MC DA twin pregnancy at [DATE] weeks is identified.

 A dividing membrane is once again identified.

 Twin A is located on the maternal  left    side.
 Twin B is located on the maternal   rigtht side.

 Twin A is cephalic.
 Twin B is breech.

 Both twins are appropriately grown.
 The fetal weight discordance measured within normal limits.
 Follow up fetal anatomy is unremarkable.

 The amniotic fluid is within normal limits in each sac.
 The MVP for Twin A is  5.2 cm.
 The MVP for Twin B is  7.8 cm.

 Recommend continued q 2 weeks ultrasounds for TTTs and q
 4 weeks for growth. (sceduled)

 Thank you for allowing us to participate in your patient's care.

                  Villeneuve, Londa

## 2020-03-15 IMAGING — US US MFM OB FOLLOW UP
1 series · 13 of 28 positions shown · non-contrast
Comparison: none

PATIENT INFO:

             DEAL
PERFORMED BY:
                   Sonographer
SERVICE(S) PROVIDED:
 ----------------------------------------------------------------------
INDICATIONS:
  24 weeks gestation of pregnancy
  Twitty/Kukreja Twins
  TTTS check
  AMA (s/p negative cell free fetal DNA screen)
FETAL EVALUATION (FETUS A):
 Num Of Fetuses:         2
 Preg. Location:         Maternal Left
 Fetal Heart Rate(bpm):  153
 Cardiac Activity:       Present
 Presentation:           Vertex
 Placenta:               Anterior, No previa [REDACTED] previously
 Membrane Desc:      Monochorionic - diamniotic
                             Largest Pocket(cm)
GESTATIONAL AGE (FETUS A):
 LMP:           22w 5d        Date:  11/21/18                 EDD:   08/28/19
 Best:          24w 3d     Det. By:  Early Ultrasound         EDD:   08/16/19
                                     (02/17/19)
ANATOMY (FETUS A):
 Stomach:               Visualized             Bladder:                Visualized
FETAL EVALUATION (FETUS B):
 Preg. Location:         Maternal Right
 Fetal Heart Rate(bpm):  158
GESTATIONAL AGE (FETUS B):
ANATOMY (FETUS B):

[Series 1: us mfm ob follow up · 0.28mm/px · 13 of 31 slices shown]
[im 2/31]
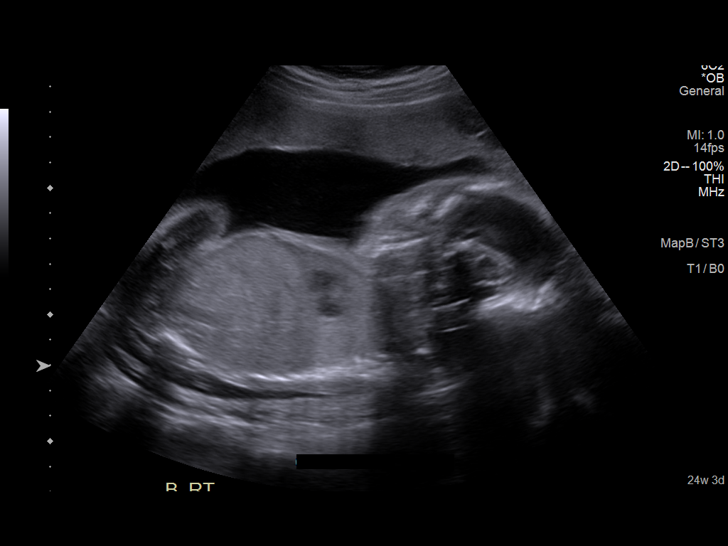
[im 4/31]
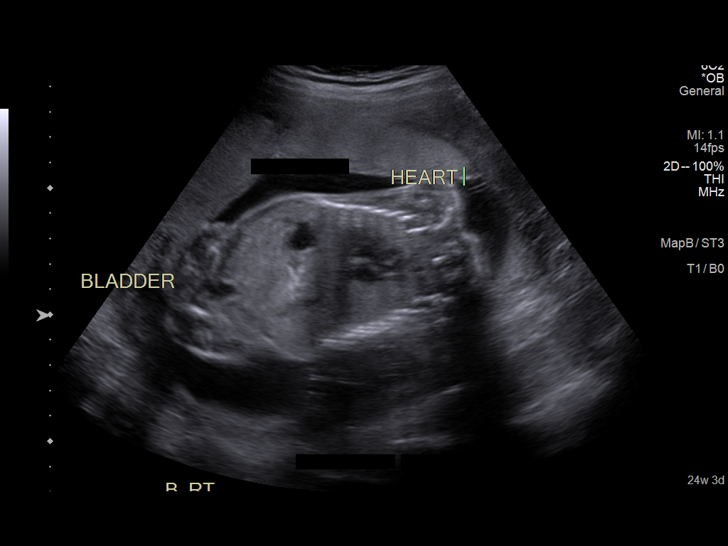
[im 6/31]
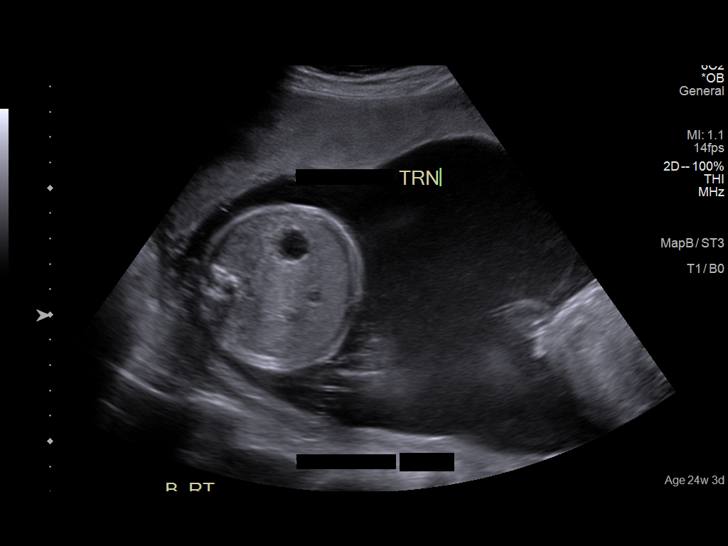
[im 8/31]
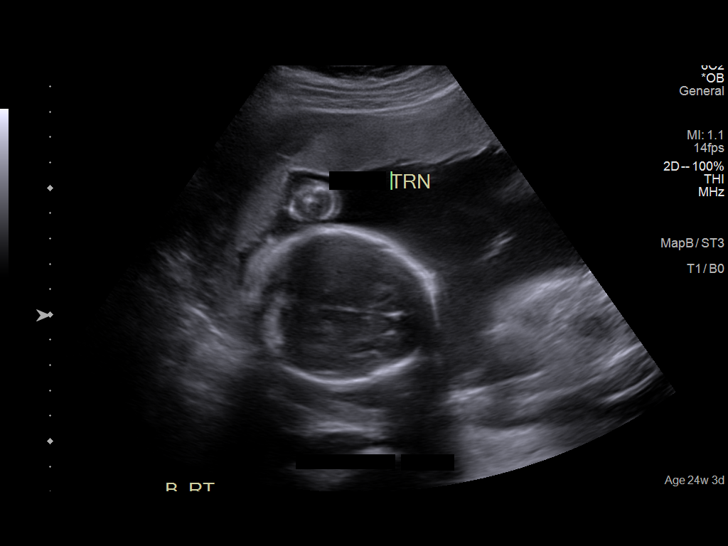
[im 11/31]
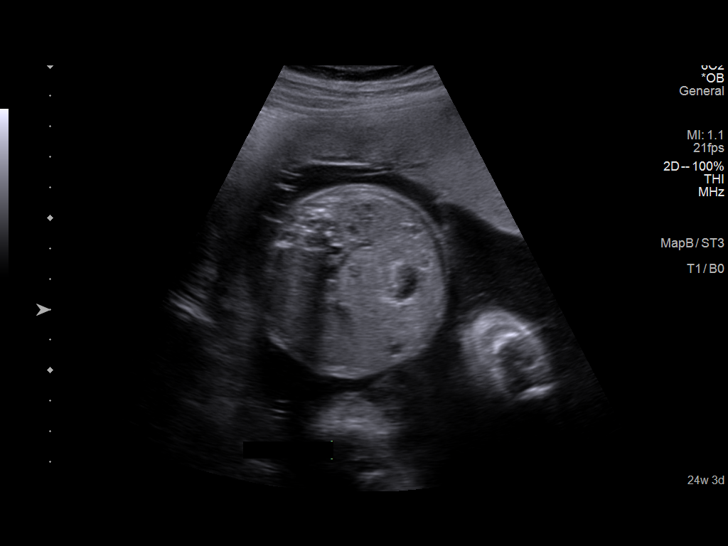
[im 13/31]
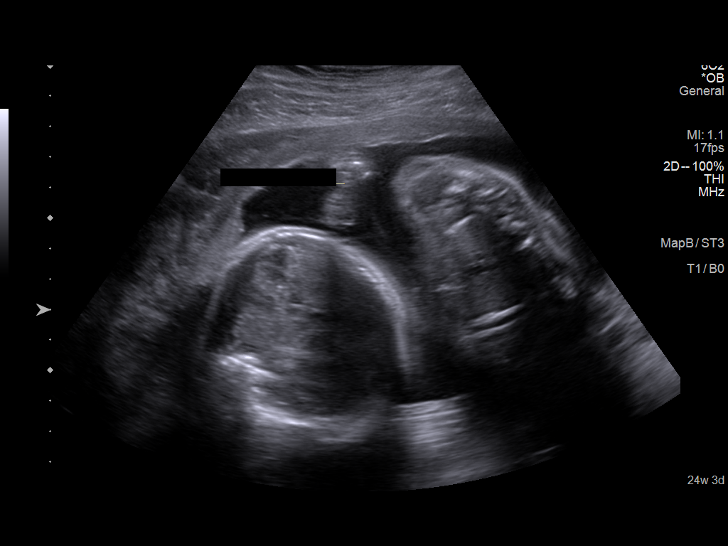
[im 16/31]
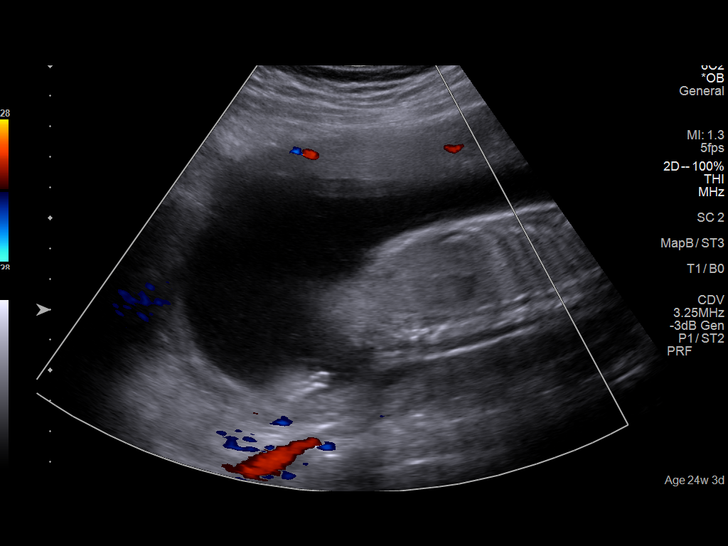
[im 18/31]
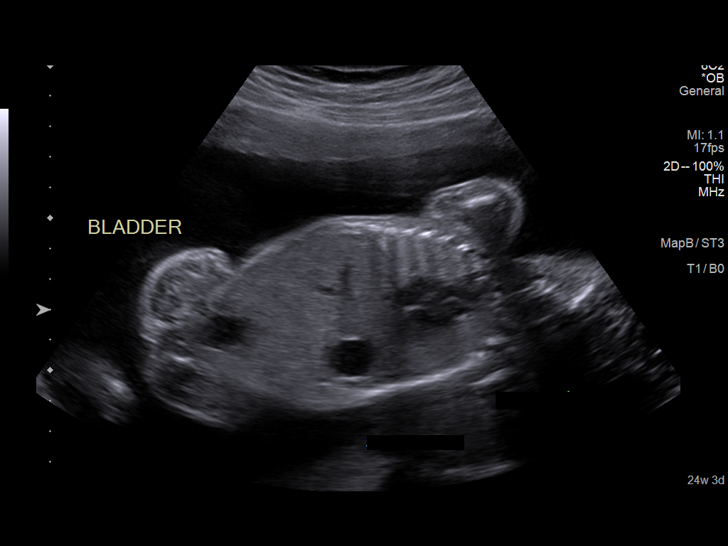
[im 21/31]
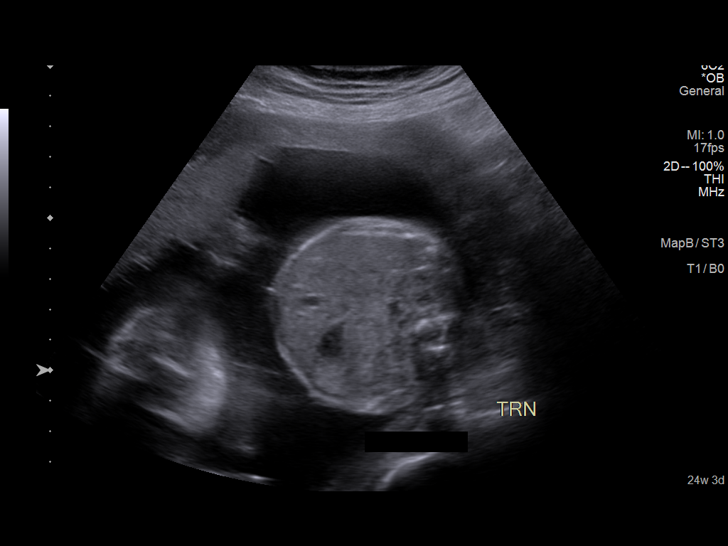
[im 23/31]
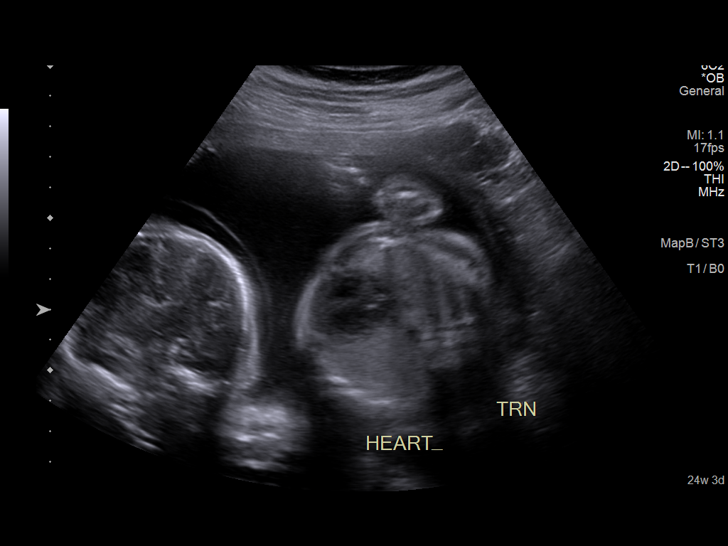
[im 25/31]
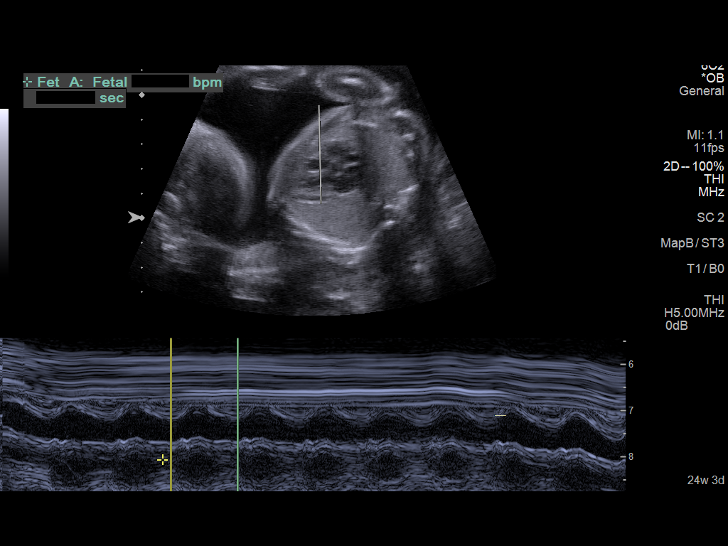
[im 27/31]
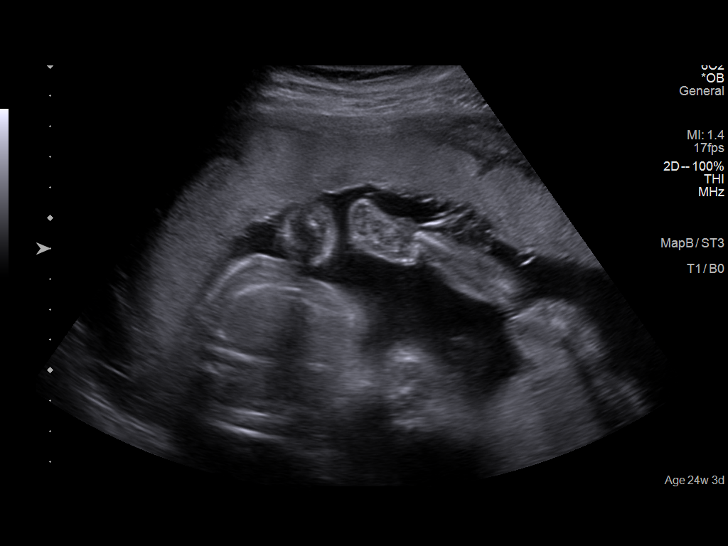
[im 29/31]
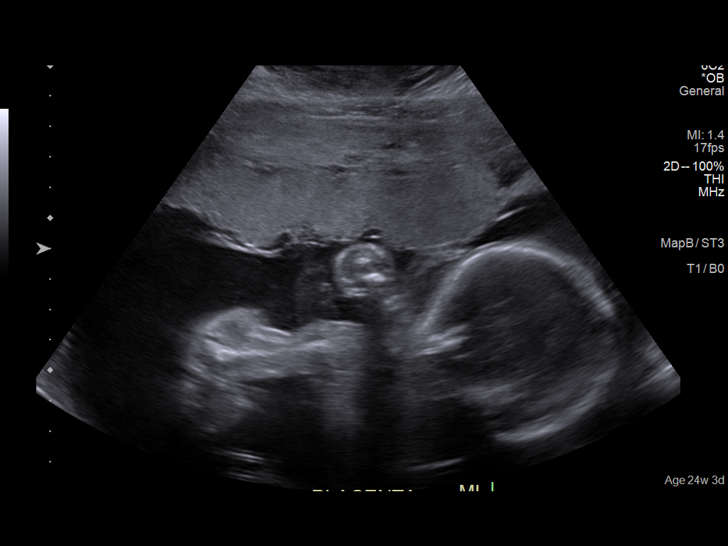

[13 of 28 positions shown; findings below may reference images not displayed]

IMPRESSION: Twin intrauterine pregnancy with a best estimated gestational
 age of 24 weeks 3 days.  Dating is based on earliest
 available ultrasound performed at [HOSPITAL] on 02/17/2019;
 measurements were reported as 14 weeks 2 days.

 Placentation has been previously documented as
 monochorionic diamniotic.

 Twin A:  ceph, maternal left, anterior placenta.
 No evidence of TTTS/hydrops.  Fetal bladder is visualized.
 MVP 6.9 cm.

 Twin B:  ceph, maternal right, anterior placenta.
 No evidence of TTTS/hydrops.  Fetal bladder is visualized.
 MVP 7.8 cm.

 Will schedule twin growth/TTTS check in 2 weeks and TTTS
 check only in 4 weeks.
                 Kil, Metehan

## 2020-04-19 ENCOUNTER — Encounter: Payer: Self-pay | Admitting: Dermatology

## 2020-04-19 ENCOUNTER — Other Ambulatory Visit: Payer: Self-pay

## 2020-04-19 ENCOUNTER — Ambulatory Visit: Payer: No Typology Code available for payment source | Admitting: Dermatology

## 2020-04-19 ENCOUNTER — Ambulatory Visit (INDEPENDENT_AMBULATORY_CARE_PROVIDER_SITE_OTHER): Payer: No Typology Code available for payment source | Admitting: Dermatology

## 2020-04-19 DIAGNOSIS — I781 Nevus, non-neoplastic: Secondary | ICD-10-CM | POA: Diagnosis not present

## 2020-04-19 DIAGNOSIS — L810 Postinflammatory hyperpigmentation: Secondary | ICD-10-CM

## 2020-04-19 MED ORDER — TRIAMCINOLONE ACETONIDE 0.1 % EX CREA: TOPICAL_CREAM | CUTANEOUS | 2 refills | Status: AC

## 2020-04-19 NOTE — Progress Notes (Signed)
   Follow-Up Visit   Subjective  Debbie Burns is a 44 y.o. female who presents for the following: area of concern (shoulder, neck and spider veins).  Patient presents today evaluation of dark patches on Shoulders (area burns) and neck. Would also like spider veins on both legs treated.  She has had them treated here in past with good result.  The following portions of the chart were reviewed this encounter and updated as appropriate:      Review of Systems:  No other skin or systemic complaints except as noted in HPI or Assessment and Plan.  Objective  Well appearing patient in no apparent distress; mood and affect are within normal limits.  A focused examination was performed including face, neck, chest and back. Relevant physical exam findings are noted in the Assessment and Plan.  Objective  Left Knee - Medial, Left Knee Lateral, Left Thigh -Medial, Right Knee -Medial: Small blue veins- Small patch on Left medial thigh, B/L medial knee and left lateral knee  Objective  Upper Back: Indistinct hyperpigmented patch on Right upper back with mild xerosis and follicular prominence    Assessment & Plan  Telangiectasia (4) Left Knee - Medial; Left Knee Lateral; Right Knee -Medial; Left Thigh -Medial  Discussed sclerotherapy, $350/tx session, and discussed risk of bruising and post inflammatory pigmentation.  Need compression hose for 2 weeks during day after procedure.  May need additional treatments to clear.  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 Use as directed. Risk of skin atrophy with long-term use reviewed.   Recommend mild soap and moisturizing cream 1-2 times daily.    triamcinolone cream (KENALOG) 0.1 % - Upper Back  Return for sclero therapy.   Allen Norris, CMA, am acting as scribe for Willeen Niece, MD  .  Documentation: I have reviewed the above documentation for accuracy and completeness, and I agree with the above.  Willeen Niece MD

## 2020-04-19 NOTE — Patient Instructions (Addendum)
Recommend daily broad spectrum sunscreen SPF 30+ to sun-exposed areas, reapply every 2 hours as needed. Call for new or changing lesions.BEFORE YOUR APPOINTMENT FOR SCLEROTHERAPY  1. When you telephone for your appointment for the sclerotherapy procedure, please let the receptionist know that you are scheduling for the fifteen (15) minute sclerotherapy procedure not just a regular visit.  2. On the day of the procedure, please cleanse and dry the areas, but do not use any moisturizers or other products on the area(s) to be treated.  3. Bring a pair of comfortable shorts to wear during the procedure.  4. Be sure to bring your recommended graduated compression stockings with you to the office. You will be wearing them home when your visit is over. These compression hose can be purchased at most medical supply stores.  After Your Sclerotherapy Procedure  1. Please wear the graduated compression stockings for 24 hours immediately following the completion of the sclerotherapy procedure.  2. We recommend that you avoid vigorous activity as much as possible for the first twenty-four (24) hours. You can do your "normal" routine, but avoid an above normal amount of time on your feet. Elevating the legs when sitting and avoidance of vigorous leg movements or exercise in the first few days after treatment may improve your results.  3. You may remove the compression dressings (cotton balls) and tape the next morning.  4. Please continue wearing the compression stockings during waking hours for the two (2) weeks following sclerotherapy.  5. If you have any blisters, sores or ulcers or other problems following your procedure please call or return to the office immediately.     THE PROCEDURE FEE IS $350.00 PER FIFTEEN (15) MINUTE SESSION. WE REQUIRE THAT THIS PROCEDURE BE PAID FOR IN FULL ON OR BEFORE THE DATE THAT IT IS PERFORMED. WE WILL GIVE YOU A RECEIPT THAT YOU CAN FILE WITH YOUR INSURANCE COMPANY. WE  GENERALLY DO NOT FILE THIS PROCEDURE WITH ANY INSURANCE COMPANY EXCEPT UNDER CERTAIN CIRCUMSTANCES WHERE PRIOR AUTHORIZATION HAS BEEN CONFIRMED. THIS PROCEDURE IS GENERALLY CONSIDERED TO BE A COSMETIC PROCEDURE BY INSURANCE COMPANIES.  Gentle Skin Care Guide  1. Bathe no more than once a day.  2. Avoid bathing in hot water  3. Use a mild soap like Dove, Vanicream, Cetaphil, CeraVe. Can use Lever 2000 or Cetaphil antibacterial soap  4. Use soap only where you need it. On most days, use it under your arms, between your legs, and on your feet. Let the water rinse other areas unless visibly dirty.  5. When you get out of the bath/shower, use a towel to gently blot your skin dry, don't rub it.  6. While your skin is still a little damp, apply a moisturizing cream such as Vanicream, CeraVe, Cetaphil, Eucerin, Sarna lotion or plain Vaseline Jelly. For hands apply Neutrogena [place] Hand Cream or Excipial Hand Cream.  7. Reapply moisturizer any time you start to itch or feel dry.  8. Sometimes using free and clear laundry detergents can be helpful. Fabric softener sheets should be avoided. Downy Free & Gentle liquid, or any liquid fabric softener that is free of dyes and perfumes, it acceptable to use  9. If your doctor has given you prescription creams you may apply moisturizers over them

## 2020-04-26 IMAGING — US US MFM OB FOLLOW-UP
1 series · 16 of 28 positions shown · non-contrast
Comparison: none

PATIENT INFO:

             MONCF
PERFORMED BY:
                   Sonographer
SERVICE(S) PROVIDED:
 ----------------------------------------------------------------------
INDICATIONS:
  30 weeks gestation of pregnancy
  Monochorionic diamniotic twin pregnancy
FETAL EVALUATION (FETUS A):
 Num Of Fetuses:         2
 Fetal Heart Rate(bpm):  157
 Cardiac Activity:       Present
 Fetal Lie:              Maternal Left
 Presentation:           Cephalic
 Placenta:               Anterior
 Membrane Desc:      Monochorionic - diamniotic
 Amniotic Fluid
 AFI FV:      Within normal limits
                             Largest Pocket(cm)
BIOMETRY (FETUS A):
 BPD:      73.9  mm     G. Age:  29w 5d         17  %    CI:        75.69   %    70 - 86
                                                         FL/HC:      20.5   %    19.2 -
 HC:      269.3  mm     G. Age:  29w 2d          3  %    HC/AC:      1.03        0.99 -
 AC:      262.6  mm     G. Age:  30w 3d         45  %    FL/BPD:     74.6   %    71 - 87
 FL:       55.1  mm     G. Age:  29w 0d          8  %    FL/AC:      21.0   %    20 - 24
 HUM:      50.1  mm     G. Age:  29w 3d         30  %
 Est. FW:    0416  gm      3 lb 3 oz     24  %     FW Discordancy      0 \ 0 %
GESTATIONAL AGE (FETUS A):
 LMP:           28w 5d        Date:  11/21/18                 EDD:   08/28/19
 U/S Today:     29w 4d                                        EDD:   08/22/19
 Best:          30w 3d     Det. By:  Early Ultrasound         EDD:   08/16/19
                                     (02/17/19)
ANATOMY (FETUS A):
 Cavum:                 Visualized             Diaphragm:              Within Normal Limits
                        previously
 Ventricles:            Normal appearance      Stomach:                Seen
 Cerebellum:            Visualized             Abdominal Wall:         Visualized
                        previously                                     previously
 Posterior Fossa:       Visualized             Cord Vessels:           3 vessels,
                        previously                                     visualized previously
 Face:                  Orbits visualized      Kidneys:                Normal appearance
 Lips:                  Visualized             Bladder:                Seen
 Heart:                 4-Chamber view         Spine:                  Normal appearance
                        appears normal
 RVOT:                  Normal appearance      Upper Extremities:      Visualized
                                                                       previously
 LVOT:                  Normal appearance      Lower Extremities:      Visualized
FETAL EVALUATION (FETUS B):
 Fetal Heart Rate(bpm):  150
 Fetal Lie:              Maternal Right
 Presentation:           Breech
 AFI FV:      Normal
BIOMETRY (FETUS B):
 BPD:      75.6  mm     G. Age:  30w 2d         35  %    CI:        77.02   %    70 - 86
                                                         FL/HC:      19.9   %    19.2 -
 HC:      272.8  mm     G. Age:  29w 5d          7  %    HC/AC:      1.04        0.99 -
 AC:      262.2  mm     G. Age:  30w 2d         44  %    FL/BPD:     72.0   %    71 - 87
 FL:       54.4  mm     G. Age:  28w 5d          6  %    FL/AC:      20.7   %    20 - 24
 HUM:      49.3  mm     G. Age:  29w 0d         21  %
 Est. FW:    2688  gm      3 lb 3 oz     23  %     FW Discordancy         0  %
GESTATIONAL AGE (FETUS B):
 U/S Today:     29w 5d                                        EDD:   08/21/19
ANATOMY (FETUS B):
 Heart:                 4-Chamber view         Spine:                  Visualized
                        appears normal                                 previously
 Ductal Arch:           Normal appearance

[Series 1: us mfm ob follow-up · 0.23mm/px · 16 of 77 slices shown]
[im 1/77]
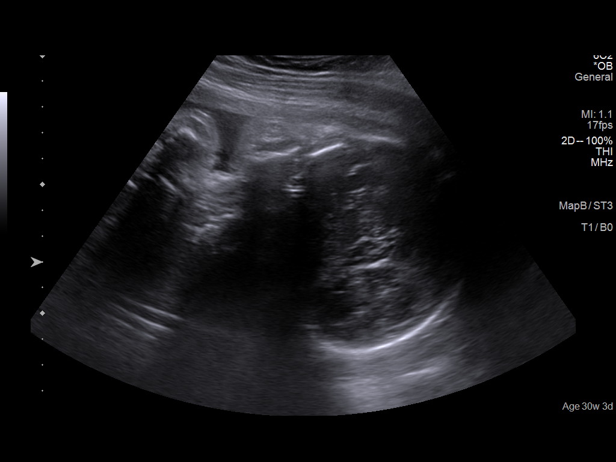
[im 6/77]
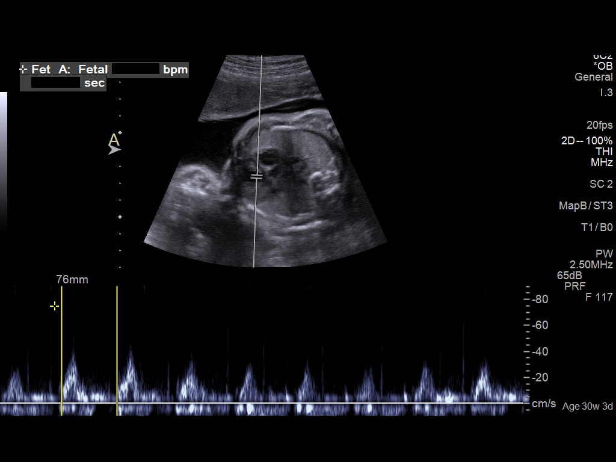
[im 12/77]
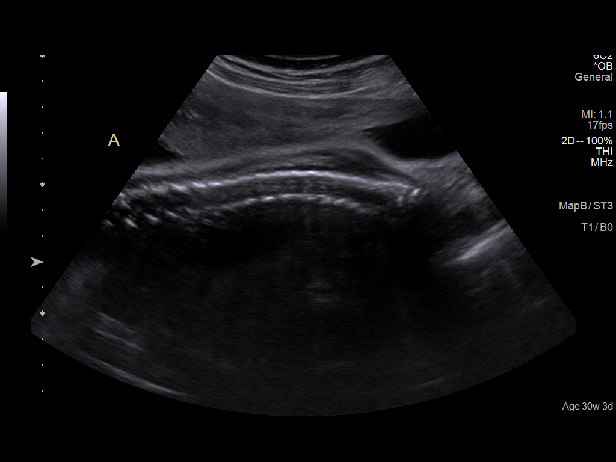
[im 17/77]
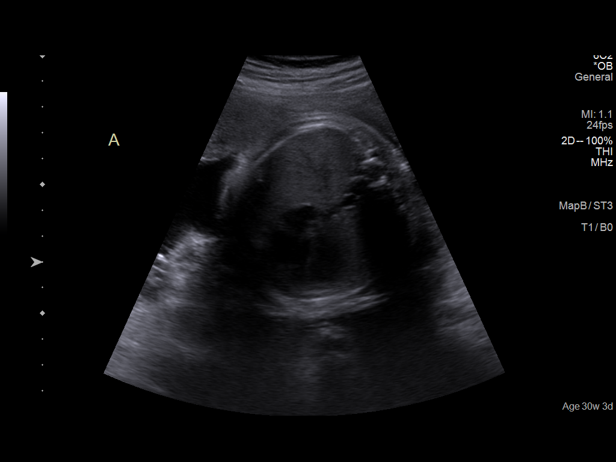
[im 20/77]
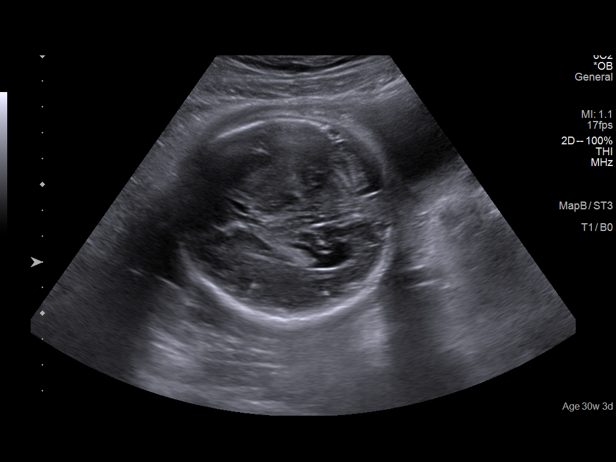
[im 26/77]
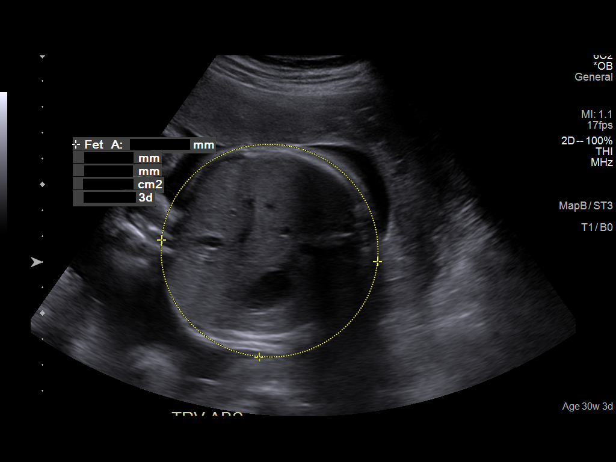
[im 31/77]
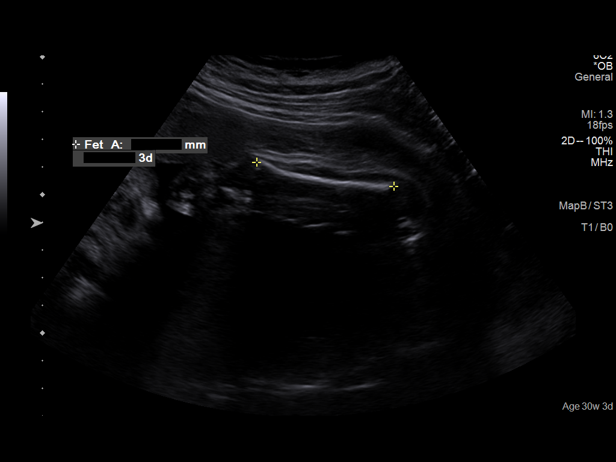
[im 37/77]
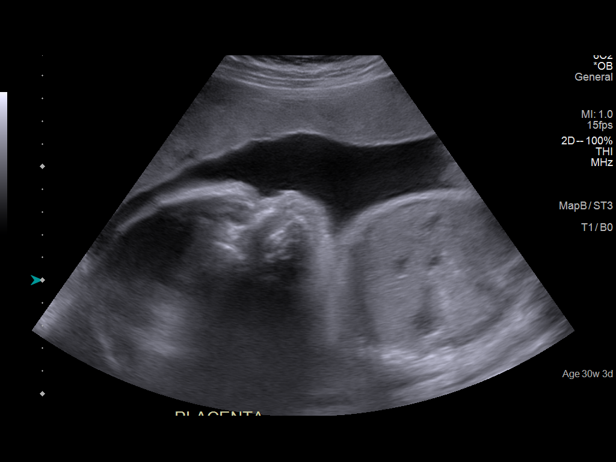
[im 40/77]
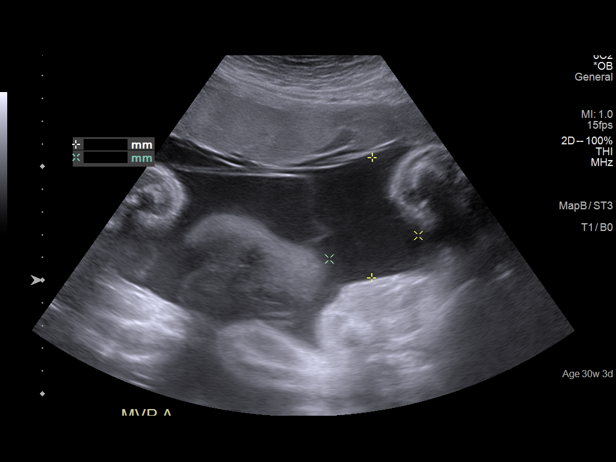
[im 46/77]
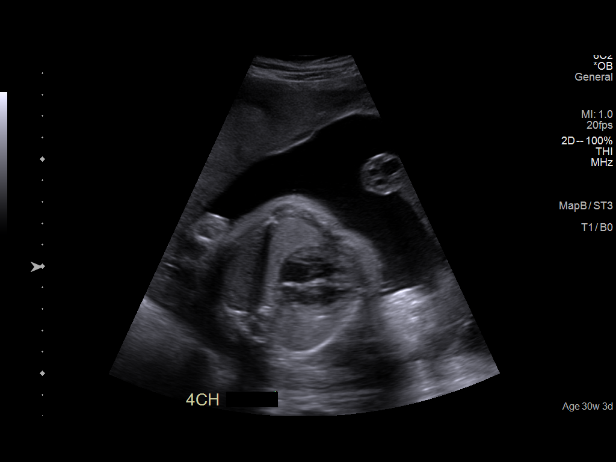
[im 51/77]
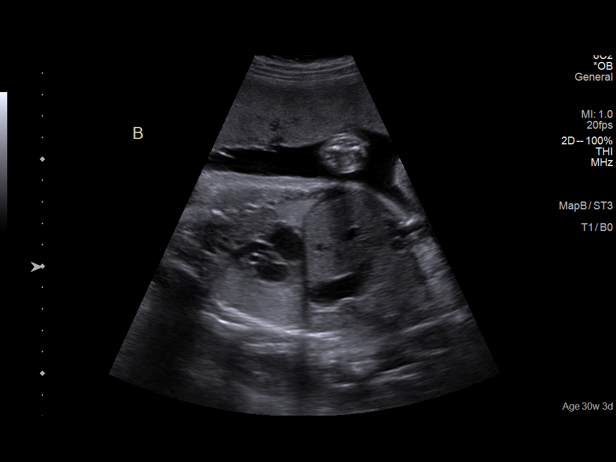
[im 57/77]
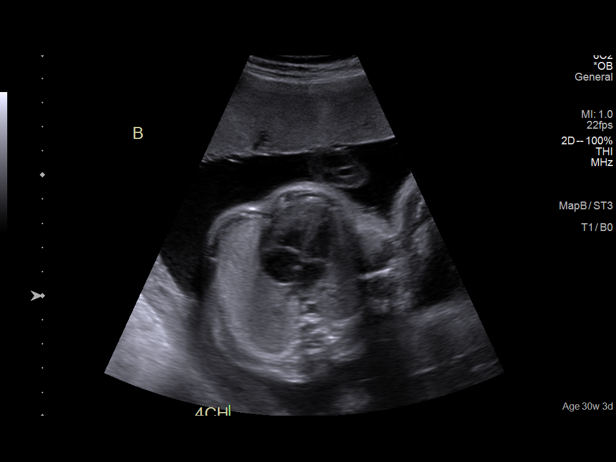
[im 60/77]
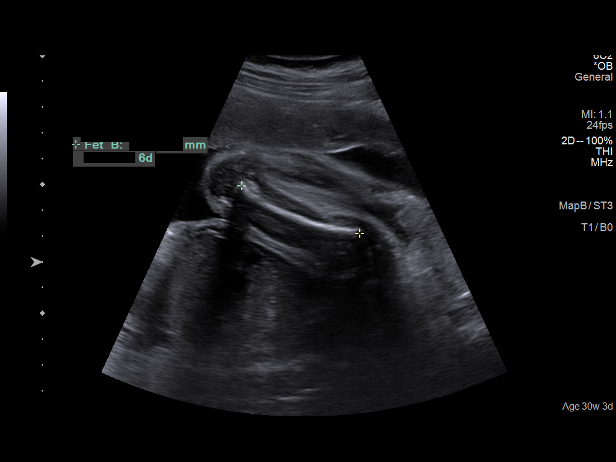
[im 65/77]
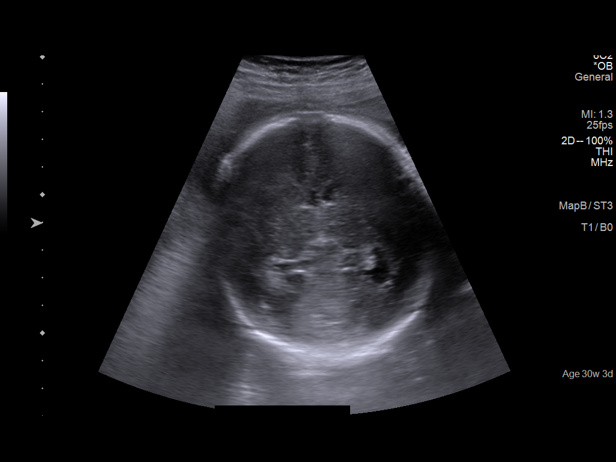
[im 71/77]
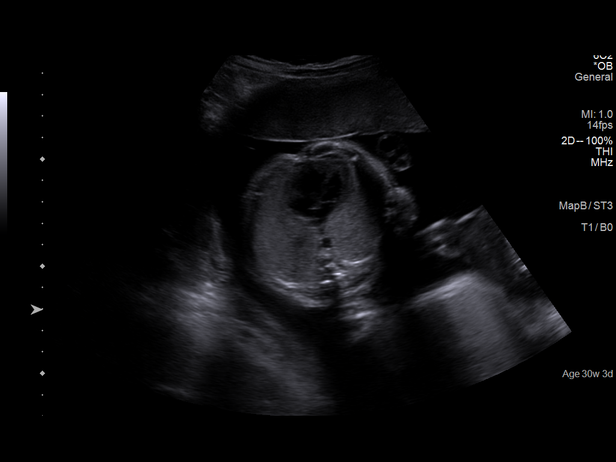
[im 77/77]
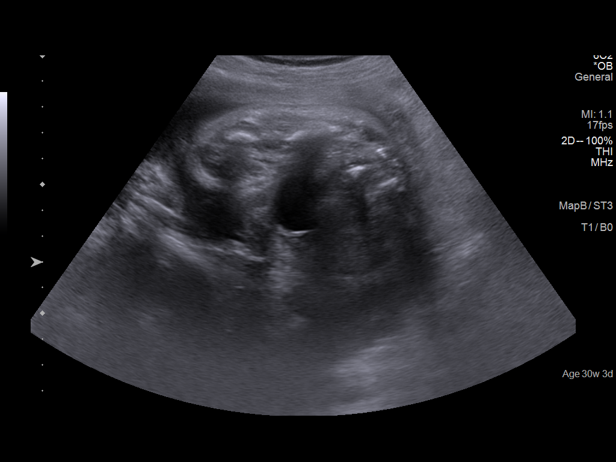

[16 of 28 positions shown; findings below may reference images not displayed]

IMPRESSION: Thank you for referring your patient  for ultrasound for
 monochorionic/diamniotic twin growth evaluation.

 A twin pregnancy at 30 weeks 3 days is identified. Dating is
 by earliest available ultrasound performed at [HOSPITAL] on
 [id number]; measurmeents were consistent with 14 weeks 2
 days.

 A thin dividing membrane is once again identified.  The
 placenta is anterior.

 Twin A is located on the maternal   left   side.
 Twin B is located on the maternal    right  side.

 Twin A is cephalic.
 Twin B is breech

 Both twins are appropriately grown.
 The fetal weight discordance measured within normal limits.

 The amniotic fluid is within normal limits in each sac.
 The MVP for Twin A is  5.3  cm.
 The MVP for Twin B is  7.3 cm.

 Fetal growth is appropriate and no signs of TTTS are seen.

 Follow uo TTTS in 2 weeks and growith in 4 weeks.

 Thank you for allowing us to participate in your patient's care.
                       Melgar, John Erik

## 2020-05-04 ENCOUNTER — Encounter: Payer: Self-pay | Admitting: Obstetrics & Gynecology

## 2020-05-22 ENCOUNTER — Ambulatory Visit: Payer: No Typology Code available for payment source | Admitting: Dermatology

## 2020-07-10 ENCOUNTER — Ambulatory Visit (INDEPENDENT_AMBULATORY_CARE_PROVIDER_SITE_OTHER): Payer: Self-pay | Admitting: Dermatology

## 2020-07-10 ENCOUNTER — Other Ambulatory Visit: Payer: Self-pay

## 2020-07-10 DIAGNOSIS — I781 Nevus, non-neoplastic: Secondary | ICD-10-CM

## 2020-07-10 NOTE — Progress Notes (Addendum)
   Follow-Up Visit   Subjective  Debbie Burns is a 44 y.o. female who presents for the following: Telangiectasias (Sclerotherapy today.).   The following portions of the chart were reviewed this encounter and updated as appropriate:      Review of Systems:  No other skin or systemic complaints except as noted in HPI or Assessment and Plan.  Objective  Well appearing patient in no apparent distress; mood and affect are within normal limits.  A focused examination was performed including legs. Relevant physical exam findings are noted in the Assessment and Plan.  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  Procedure: The patient was counseled and understands about the effects, side effects and potential risks and complications of the sclerotherapy procedure, including post inflammatory hyperpigmentation. The patient was given the opportunity to ask questions. Asclera (polidocanol) 0.5% (total 2 cc) was injected into the varices. In order to ensure correct placement of the catheter in the vein, I drew back slightly to give moderate blood show in the syringe. If there was any evidence or suspicion of extravasation of sclerosant, the area was immediately diluted with a large volume of 0.9% saline. A pressure dressing was applied immediately to the injected sites. The patient tolerated the procedure well without complication. The patient was instructed in post-operative compression stocking use. The patient understands to call or return immediately if any problems noted.   Return in about 8 weeks  (around 09/04/2020) for sclero follow-up.   Documentation: I have reviewed the above documentation for accuracy and completeness, and I agree with the above.  Willeen Niece MD

## 2020-07-10 NOTE — Patient Instructions (Signed)
BEFORE YOUR APPOINTMENT FOR SCLEROTHERAPY  1. When you telephone for your appointment for the sclerotherapy procedure, please let the receptionist know that you are scheduling for the fifteen (15) minute sclerotherapy procedure not just a regular visit.  2. On the day of the procedure, please cleanse and dry the areas, but do not use any moisturizers or other products on the area(s) to be treated.  3. Bring a pair of comfortable shorts to wear during the procedure.  4. Be sure to bring your recommended graduated compression stockings with you to the office. You will be wearing them home when your visit is over. These compression hose can be purchased at most medical supply stores.  After Your Sclerotherapy Procedure  1. Please wear the graduated compression stockings for 24 hours immediately following the completion of the sclerotherapy procedure.  2. We recommend that you avoid vigorous activity as much as possible for the first twenty-four (24) hours. You can do your "normal" routine, but avoid an above normal amount of time on your feet. Elevating the legs when sitting and avoidance of vigorous leg movements or exercise in the first few days after treatment may improve your results.  3. You may remove the compression dressings (cotton balls) and tape the next morning.  4. Please continue wearing the compression stockings during waking hours for the two (2) weeks following sclerotherapy.  5. If you have any blisters, sores or ulcers or other problems following your procedure please call or return to the office immediately.     THE PROCEDURE FEE IS $350.00 PER FIFTEEN (15) MINUTE SESSION. WE REQUIRE THAT THIS PROCEDURE BE PAID FOR IN FULL ON OR BEFORE THE DATE THAT IT IS PERFORMED. WE WILL GIVE YOU A RECEIPT THAT YOU CAN FILE WITH YOUR INSURANCE COMPANY. WE GENERALLY DO NOT FILE THIS PROCEDURE WITH ANY INSURANCE COMPANY EXCEPT UNDER CERTAIN CIRCUMSTANCES WHERE PRIOR AUTHORIZATION HAS BEEN  CONFIRMED. THIS PROCEDURE IS GENERALLY CONSIDERED TO BE A COSMETIC PROCEDURE BY INSURANCE COMPANIES. 

## 2020-09-04 ENCOUNTER — Ambulatory Visit: Payer: No Typology Code available for payment source | Admitting: Dermatology

## 2020-10-17 ENCOUNTER — Other Ambulatory Visit: Payer: No Typology Code available for payment source

## 2020-10-17 DIAGNOSIS — Z20822 Contact with and (suspected) exposure to covid-19: Secondary | ICD-10-CM

## 2020-10-19 LAB — SARS-COV-2, NAA 2 DAY TAT

## 2020-10-19 LAB — NOVEL CORONAVIRUS, NAA: SARS-CoV-2, NAA: NOT DETECTED

## 2021-03-08 ENCOUNTER — Ambulatory Visit
Admission: RE | Admit: 2021-03-08 | Discharge: 2021-03-08 | Disposition: A | Discharge status: Home / Self Care | Payer: No Typology Code available for payment source | Source: Ambulatory Visit | Attending: Primary Care | Admitting: Primary Care

## 2021-03-08 ENCOUNTER — Other Ambulatory Visit: Payer: Self-pay

## 2021-03-08 ENCOUNTER — Other Ambulatory Visit: Payer: Self-pay | Admitting: Primary Care

## 2021-03-08 ENCOUNTER — Other Ambulatory Visit (HOSPITAL_COMMUNITY): Payer: Self-pay | Admitting: Primary Care

## 2021-03-08 DIAGNOSIS — Z3201 Encounter for pregnancy test, result positive: Secondary | ICD-10-CM | POA: Diagnosis not present

## 2021-03-12 ENCOUNTER — Other Ambulatory Visit: Payer: Self-pay | Admitting: Primary Care

## 2021-03-12 ENCOUNTER — Other Ambulatory Visit (HOSPITAL_COMMUNITY): Payer: Self-pay | Admitting: Primary Care

## 2021-03-12 DIAGNOSIS — Z3201 Encounter for pregnancy test, result positive: Secondary | ICD-10-CM

## 2021-03-15 LAB — OB RESULTS CONSOLE VARICELLA ZOSTER ANTIBODY, IGG: Varicella: IMMUNE

## 2021-03-15 LAB — OB RESULTS CONSOLE HEPATITIS B SURFACE ANTIGEN: Hepatitis B Surface Ag: NEGATIVE

## 2021-03-15 LAB — OB RESULTS CONSOLE RUBELLA ANTIBODY, IGM: Rubella: IMMUNE

## 2021-03-15 LAB — OB RESULTS CONSOLE RPR: RPR: NONREACTIVE

## 2021-03-15 LAB — OB RESULTS CONSOLE HIV ANTIBODY (ROUTINE TESTING): HIV: NONREACTIVE

## 2021-03-27 ENCOUNTER — Other Ambulatory Visit: Payer: Self-pay

## 2021-03-27 ENCOUNTER — Ambulatory Visit
Admission: RE | Admit: 2021-03-27 | Discharge: 2021-03-27 | Disposition: A | Discharge status: Home / Self Care | Payer: PRIVATE HEALTH INSURANCE | Source: Ambulatory Visit | Attending: Primary Care | Admitting: Primary Care

## 2021-03-27 DIAGNOSIS — Z3201 Encounter for pregnancy test, result positive: Secondary | ICD-10-CM | POA: Diagnosis not present

## 2021-07-25 ENCOUNTER — Other Ambulatory Visit: Payer: Self-pay

## 2021-07-25 ENCOUNTER — Encounter: Payer: Self-pay | Admitting: *Deleted

## 2021-07-25 ENCOUNTER — Encounter
Admission: EM | Disposition: A | Discharge status: Home / Self Care | Payer: Self-pay | Source: Home / Self Care | Attending: Obstetrics and Gynecology

## 2021-07-25 ENCOUNTER — Inpatient Hospital Stay
Admission: EM | Admit: 2021-07-25 | Discharge: 2021-07-26 | DRG: 785 | Disposition: A | Discharge status: Home / Self Care | Payer: No Typology Code available for payment source | Attending: Obstetrics and Gynecology | Admitting: Obstetrics and Gynecology

## 2021-07-25 ENCOUNTER — Inpatient Hospital Stay: Payer: No Typology Code available for payment source | Admitting: Certified Registered"

## 2021-07-25 DIAGNOSIS — O26893 Other specified pregnancy related conditions, third trimester: Secondary | ICD-10-CM | POA: Diagnosis present

## 2021-07-25 DIAGNOSIS — Z3A38 38 weeks gestation of pregnancy: Secondary | ICD-10-CM | POA: Diagnosis not present

## 2021-07-25 DIAGNOSIS — Z20822 Contact with and (suspected) exposure to covid-19: Secondary | ICD-10-CM | POA: Diagnosis present

## 2021-07-25 DIAGNOSIS — O34219 Maternal care for unspecified type scar from previous cesarean delivery: Principal | ICD-10-CM | POA: Diagnosis present

## 2021-07-25 DIAGNOSIS — Z302 Encounter for sterilization: Secondary | ICD-10-CM

## 2021-07-25 DIAGNOSIS — O24425 Gestational diabetes mellitus in childbirth, controlled by oral hypoglycemic drugs: Secondary | ICD-10-CM | POA: Diagnosis present

## 2021-07-25 LAB — CBC
HCT: 37.7 % (ref 36.0–46.0)
Hemoglobin: 13.4 g/dL (ref 12.0–15.0)
MCH: 33.2 pg (ref 26.0–34.0)
MCHC: 35.5 g/dL (ref 30.0–36.0)
MCV: 93.3 fL (ref 80.0–100.0)
Platelets: 195 10*3/uL (ref 150–400)
RBC: 4.04 MIL/uL (ref 3.87–5.11)
RDW: 14 % (ref 11.5–15.5)
WBC: 13.7 10*3/uL — ABNORMAL HIGH (ref 4.0–10.5)
nRBC: 0 % (ref 0.0–0.2)

## 2021-07-25 LAB — CREATININE, SERUM
Creatinine, Ser: 0.64 mg/dL (ref 0.44–1.00)
GFR, Estimated: >60 mL/min (ref >60)

## 2021-07-25 LAB — TYPE AND SCREEN
ABO/RH(D): O POS
Antibody Screen: NEGATIVE

## 2021-07-25 LAB — GLUCOSE, CAPILLARY: Glucose-Capillary: 104 mg/dL — ABNORMAL HIGH (ref 70–99)

## 2021-07-25 LAB — RESP PANEL BY RT-PCR (FLU A&B, COVID) ARPGX2
Influenza A by PCR: NEGATIVE
Influenza B by PCR: NEGATIVE
SARS Coronavirus 2 by RT PCR: NEGATIVE

## 2021-07-25 LAB — RPR: RPR Ser Ql: NONREACTIVE

## 2021-07-25 SURGERY — Surgical Case
Anesthesia: Spinal

## 2021-07-25 SURGERY — Surgical Case
Anesthesia: Choice | Laterality: Bilateral

## 2021-07-25 MED ORDER — PHENYLEPHRINE HCL-NACL 20-0.9 MG/250ML-% IV SOLN
INTRAVENOUS | Status: DC | PRN
  Administered 2021-07-25: 50 ug/min via INTRAVENOUS

## 2021-07-25 MED ORDER — WITCH HAZEL-GLYCERIN EX PADS: 1.0000 "application " | MEDICATED_PAD | CUTANEOUS | Status: DC | PRN

## 2021-07-25 MED ORDER — 0.9 % SODIUM CHLORIDE (POUR BTL) OPTIME
TOPICAL | Status: DC | PRN
  Administered 2021-07-25: 1000 mL

## 2021-07-25 MED ORDER — KETOROLAC TROMETHAMINE 30 MG/ML IJ SOLN
30.0000 mg | Freq: Four times a day (QID) | INTRAMUSCULAR | Status: AC
  Administered 2021-07-25 – 2021-07-26 (×4): 30 mg via INTRAVENOUS
  Filled 2021-07-25 (×4): qty 1

## 2021-07-25 MED ORDER — MORPHINE SULFATE (PF) 2 MG/ML IV SOLN: 1.0000 mg | INTRAVENOUS | Status: DC | PRN

## 2021-07-25 MED ORDER — DIPHENHYDRAMINE HCL 50 MG/ML IJ SOLN: 12.5000 mg | INTRAMUSCULAR | Status: DC | PRN

## 2021-07-25 MED ORDER — NALBUPHINE HCL 10 MG/ML IJ SOLN: 5.0000 mg | INTRAMUSCULAR | Status: DC | PRN

## 2021-07-25 MED ORDER — LACTATED RINGERS IV BOLUS
1000.0000 mL | Freq: Once | INTRAVENOUS | Status: AC
  Administered 2021-07-25: 1000 mL via INTRAVENOUS

## 2021-07-25 MED ORDER — FENTANYL CITRATE (PF) 100 MCG/2ML IJ SOLN
INTRAMUSCULAR | Status: DC | PRN
  Administered 2021-07-25: 15 ug via INTRATHECAL

## 2021-07-25 MED ORDER — SODIUM CHLORIDE (PF) 0.9 % IJ SOLN
INTRAMUSCULAR | Status: AC
  Filled 2021-07-25: qty 50

## 2021-07-25 MED ORDER — SODIUM CHLORIDE 0.9% FLUSH
50.0000 mL | Freq: Once | INTRAVENOUS | Status: DC
  Filled 2021-07-25: qty 51

## 2021-07-25 MED ORDER — OXYCODONE HCL 5 MG PO TABS: 5.0000 mg | ORAL_TABLET | ORAL | Status: DC | PRN

## 2021-07-25 MED ORDER — NALBUPHINE HCL 10 MG/ML IJ SOLN
5.0000 mg | INTRAMUSCULAR | Status: DC | PRN
  Administered 2021-07-25: 5 mg via INTRAVENOUS
  Filled 2021-07-25: qty 1

## 2021-07-25 MED ORDER — NALBUPHINE HCL 10 MG/ML IJ SOLN: 5.0000 mg | Freq: Once | INTRAMUSCULAR | Status: DC | PRN

## 2021-07-25 MED ORDER — PHENYLEPHRINE HCL (PRESSORS) 10 MG/ML IV SOLN
INTRAVENOUS | Status: DC | PRN
  Administered 2021-07-25 (×3): 160 ug via INTRAVENOUS
  Administered 2021-07-25: 80 ug via INTRAVENOUS

## 2021-07-25 MED ORDER — ONDANSETRON HCL 4 MG/2ML IJ SOLN: 4.0000 mg | Freq: Three times a day (TID) | INTRAMUSCULAR | Status: DC | PRN

## 2021-07-25 MED ORDER — ONDANSETRON HCL 4 MG/2ML IJ SOLN
INTRAMUSCULAR | Status: DC | PRN
  Administered 2021-07-25: 4 mg via INTRAVENOUS

## 2021-07-25 MED ORDER — SIMETHICONE 80 MG PO CHEW: 80.0000 mg | CHEWABLE_TABLET | ORAL | Status: DC | PRN

## 2021-07-25 MED ORDER — BUPIVACAINE HCL (PF) 0.5 % IJ SOLN
INTRAMUSCULAR | Status: AC
  Filled 2021-07-25: qty 60

## 2021-07-25 MED ORDER — LACTATED RINGERS IV SOLN: INTRAVENOUS | Status: DC

## 2021-07-25 MED ORDER — ACETAMINOPHEN 325 MG PO TABS: 650.0000 mg | ORAL_TABLET | Freq: Four times a day (QID) | ORAL | Status: DC

## 2021-07-25 MED ORDER — ZOLPIDEM TARTRATE 5 MG PO TABS: 5.0000 mg | ORAL_TABLET | Freq: Every evening | ORAL | Status: DC | PRN

## 2021-07-25 MED ORDER — ENOXAPARIN SODIUM 40 MG/0.4ML IJ SOSY
40.0000 mg | PREFILLED_SYRINGE | INTRAMUSCULAR | Status: DC
  Administered 2021-07-26: 40 mg via SUBCUTANEOUS
  Filled 2021-07-25: qty 0.4

## 2021-07-25 MED ORDER — MORPHINE SULFATE (PF) 0.5 MG/ML IJ SOLN
INTRAMUSCULAR | Status: DC | PRN
  Administered 2021-07-25: .1 mg via EPIDURAL

## 2021-07-25 MED ORDER — DIPHENHYDRAMINE HCL 25 MG PO CAPS: 25.0000 mg | ORAL_CAPSULE | ORAL | Status: DC | PRN

## 2021-07-25 MED ORDER — PRENATAL MULTIVITAMIN CH
1.0000 | ORAL_TABLET | Freq: Every day | ORAL | Status: DC
  Administered 2021-07-25 – 2021-07-26 (×2): 1 via ORAL
  Filled 2021-07-25 (×2): qty 1

## 2021-07-25 MED ORDER — DIPHENHYDRAMINE HCL 25 MG PO CAPS: 25.0000 mg | ORAL_CAPSULE | Freq: Four times a day (QID) | ORAL | Status: DC | PRN

## 2021-07-25 MED ORDER — MENTHOL 3 MG MT LOZG
1.0000 | LOZENGE | OROMUCOSAL | Status: DC | PRN
  Filled 2021-07-25: qty 9

## 2021-07-25 MED ORDER — KETOROLAC TROMETHAMINE 30 MG/ML IJ SOLN: 30.0000 mg | Freq: Four times a day (QID) | INTRAMUSCULAR | Status: DC

## 2021-07-25 MED ORDER — SENNOSIDES-DOCUSATE SODIUM 8.6-50 MG PO TABS
2.0000 | ORAL_TABLET | Freq: Every day | ORAL | Status: DC
  Administered 2021-07-26: 2 via ORAL
  Filled 2021-07-25: qty 2

## 2021-07-25 MED ORDER — CEFAZOLIN SODIUM-DEXTROSE 2-4 GM/100ML-% IV SOLN
2.0000 g | INTRAVENOUS | Status: AC
Start: 2021-07-25 — End: 2021-07-25
  Administered 2021-07-25: 2 g via INTRAVENOUS

## 2021-07-25 MED ORDER — OXYTOCIN-SODIUM CHLORIDE 30-0.9 UT/500ML-% IV SOLN
INTRAVENOUS | Status: AC
  Administered 2021-07-25: 41.7 mL
  Filled 2021-07-25: qty 500

## 2021-07-25 MED ORDER — SOD CITRATE-CITRIC ACID 500-334 MG/5ML PO SOLN
ORAL | Status: AC
  Administered 2021-07-25: 30 mL
  Filled 2021-07-25: qty 15

## 2021-07-25 MED ORDER — SIMETHICONE 80 MG PO CHEW
80.0000 mg | CHEWABLE_TABLET | Freq: Three times a day (TID) | ORAL | Status: DC
  Administered 2021-07-25 – 2021-07-26 (×4): 80 mg via ORAL
  Filled 2021-07-25 (×4): qty 1

## 2021-07-25 MED ORDER — TETANUS-DIPHTH-ACELL PERTUSSIS 5-2.5-18.5 LF-MCG/0.5 IM SUSY: 0.5000 mL | PREFILLED_SYRINGE | Freq: Once | INTRAMUSCULAR | Status: DC

## 2021-07-25 MED ORDER — BUPIVACAINE HCL (PF) 0.5 % IJ SOLN: 60.0000 mL | Freq: Once | INTRAMUSCULAR | Status: DC

## 2021-07-25 MED ORDER — FENTANYL CITRATE (PF) 100 MCG/2ML IJ SOLN
INTRAMUSCULAR | Status: AC
  Filled 2021-07-25: qty 2

## 2021-07-25 MED ORDER — BUPIVACAINE HCL (PF) 0.5 % IJ SOLN
INTRAMUSCULAR | Status: DC | PRN
  Administered 2021-07-25: 30 mL

## 2021-07-25 MED ORDER — IBUPROFEN 600 MG PO TABS
600.0000 mg | ORAL_TABLET | Freq: Four times a day (QID) | ORAL | Status: DC
  Administered 2021-07-26 (×2): 600 mg via ORAL
  Filled 2021-07-25 (×2): qty 1

## 2021-07-25 MED ORDER — OXYTOCIN-SODIUM CHLORIDE 30-0.9 UT/500ML-% IV SOLN
INTRAVENOUS | Status: AC
  Filled 2021-07-25: qty 500

## 2021-07-25 MED ORDER — NALOXONE HCL 0.4 MG/ML IJ SOLN: 0.4000 mg | INTRAMUSCULAR | Status: DC | PRN

## 2021-07-25 MED ORDER — SODIUM CHLORIDE 0.9% FLUSH: 3.0000 mL | INTRAVENOUS | Status: DC | PRN

## 2021-07-25 MED ORDER — COCONUT OIL OIL
1.0000 "application " | TOPICAL_OIL | Status: DC | PRN
  Administered 2021-07-25: 1 via TOPICAL
  Filled 2021-07-25: qty 120

## 2021-07-25 MED ORDER — OXYTOCIN-SODIUM CHLORIDE 30-0.9 UT/500ML-% IV SOLN
INTRAVENOUS | Status: DC | PRN
  Administered 2021-07-25: 250 mL/h via INTRAVENOUS

## 2021-07-25 MED ORDER — ACETAMINOPHEN 500 MG PO TABS
1000.0000 mg | ORAL_TABLET | Freq: Four times a day (QID) | ORAL | Status: DC
  Administered 2021-07-25 – 2021-07-26 (×5): 1000 mg via ORAL
  Filled 2021-07-25 (×5): qty 2

## 2021-07-25 MED ORDER — DIBUCAINE (PERIANAL) 1 % EX OINT: 1.0000 "application " | TOPICAL_OINTMENT | CUTANEOUS | Status: DC | PRN

## 2021-07-25 MED ORDER — MORPHINE SULFATE (PF) 0.5 MG/ML IJ SOLN
INTRAMUSCULAR | Status: AC
  Filled 2021-07-25: qty 10

## 2021-07-25 MED ORDER — BUPIVACAINE IN DEXTROSE 0.75-8.25 % IT SOLN
INTRATHECAL | Status: DC | PRN
  Administered 2021-07-25: 1.6 mL via INTRATHECAL

## 2021-07-25 MED ORDER — SOD CITRATE-CITRIC ACID 500-334 MG/5ML PO SOLN: 30.0000 mL | ORAL | Status: DC

## 2021-07-25 MED ORDER — OXYTOCIN-SODIUM CHLORIDE 30-0.9 UT/500ML-% IV SOLN
2.5000 [IU]/h | INTRAVENOUS | Status: AC
  Filled 2021-07-25: qty 500

## 2021-07-25 MED ORDER — CEFAZOLIN SODIUM-DEXTROSE 2-4 GM/100ML-% IV SOLN
INTRAVENOUS | Status: AC
  Filled 2021-07-25: qty 100

## 2021-07-25 MED ORDER — ONDANSETRON HCL 4 MG/2ML IJ SOLN
INTRAMUSCULAR | Status: AC
  Filled 2021-07-25: qty 2

## 2021-07-25 MED ORDER — NALOXONE HCL 4 MG/10ML IJ SOLN
1.0000 ug/kg/h | INTRAVENOUS | Status: DC | PRN
  Filled 2021-07-25: qty 5

## 2021-07-25 SURGICAL SUPPLY — 27 items
BARRIER ADHS 3X4 INTERCEED (GAUZE/BANDAGES/DRESSINGS) ×2 IMPLANT
CHLORAPREP W/TINT 26 (MISCELLANEOUS) ×2 IMPLANT
DRSG OPSITE POSTOP 4X10 (GAUZE/BANDAGES/DRESSINGS) ×2 IMPLANT
DRSG TELFA 3X8 NADH (GAUZE/BANDAGES/DRESSINGS) ×2 IMPLANT
ELECT CAUTERY BLADE 6.4 (BLADE) ×2 IMPLANT
ELECT REM PT RETURN 9FT ADLT (ELECTROSURGICAL) ×2
ELECTRODE REM PT RTRN 9FT ADLT (ELECTROSURGICAL) ×1 IMPLANT
GAUZE SPONGE 4X4 12PLY STRL (GAUZE/BANDAGES/DRESSINGS) ×2 IMPLANT
GLOVE SURG SYN 8.0 (GLOVE) ×2 IMPLANT
GOWN STRL REUS W/ TWL LRG LVL3 (GOWN DISPOSABLE) ×2 IMPLANT
GOWN STRL REUS W/ TWL XL LVL3 (GOWN DISPOSABLE) ×1 IMPLANT
GOWN STRL REUS W/TWL LRG LVL3 (GOWN DISPOSABLE) ×2
GOWN STRL REUS W/TWL XL LVL3 (GOWN DISPOSABLE) ×1
MANIFOLD NEPTUNE II (INSTRUMENTS) ×2 IMPLANT
MAT PREVALON FULL STRYKER (MISCELLANEOUS) ×2 IMPLANT
NEEDLE HYPO 22GX1.5 SAFETY (NEEDLE) ×2 IMPLANT
NS IRRIG 1000ML POUR BTL (IV SOLUTION) ×2 IMPLANT
PACK C SECTION AR (MISCELLANEOUS) ×2 IMPLANT
PAD OB MATERNITY 4.3X12.25 (PERSONAL CARE ITEMS) ×2 IMPLANT
PAD PREP 24X41 OB/GYN DISP (PERSONAL CARE ITEMS) ×2 IMPLANT
SCRUB EXIDINE 4% CHG 4OZ (MISCELLANEOUS) ×2 IMPLANT
STRAP SAFETY 5IN WIDE (MISCELLANEOUS) ×2 IMPLANT
SUT CHROMIC 1 CTX 36 (SUTURE) ×6 IMPLANT
SUT PLAIN GUT 0 (SUTURE) ×4 IMPLANT
SUT VIC AB 0 CT1 36 (SUTURE) ×4 IMPLANT
SYR 30ML LL (SYRINGE) ×4 IMPLANT
WATER STERILE IRR 500ML POUR (IV SOLUTION) ×2 IMPLANT

## 2021-07-25 NOTE — Lactation Note (Signed)
This note was copied from a baby's chart. Lactation Consultation Note  Patient Name: Debbie Burns OMVEH'M Date: 07/25/2021   Age:43 hours  Mom is P4, with 75yr old twins and 46yr old daughter at home. She has some breastfeeding experience, but limited. She reports BF for 1 month with the twins stating "they didn't like it".  Baby has fed well at the breast a few times since delivery, mom has been independent with feedings, has not called for observed feeding or assistance.   [facility] student provided mom with breastfeeding basic education: newborn stomach size, feeding patterns and early cues, feeding on demand, output expectations, and cluster feeding overnight. Mom was encouraged to feed baby on demand, and offer skin to skin time.  Mom had no questions at this time. Encouraged to call for support as needed.  Maternal Data    Feeding    LATCH Score                    Lactation Tools Discussed/Used    Interventions    Discharge    Consult Status      Danford Bad 07/25/2021, 4:26 PM

## 2021-07-25 NOTE — Progress Notes (Signed)
Pt presents this am in early labor and is a previous c/s . She desires a repeat and was scheduled for next Monday . I have counseled the pt for the procedure . Risks discussed . All questions answered . She also desires BTL which she has been committed to for some time . Failure rate discussed

## 2021-07-25 NOTE — Discharge Instructions (Addendum)
Discharge Instructions:   Follow-up post-op appointment: Thursday, 11/3 at 3:45pm with Dr. Feliberto Gottron at [facility]!  If there are any new medications, they have been ordered and will be available for pickup at the listed pharmacy on your way home from the hospital.   Call office if you have any of the following: headache, visual changes, fever >101.0 F, chills, shortness of breath, breast concerns, excessive vaginal bleeding, incision drainage or problems, leg pain or redness, depression or any other concerns. If you have vaginal discharge with an odor, let your doctor know.   It is normal to bleed for up to 6 weeks. You should not soak through more than 1 pad in 1 hour. If you have a blood clot larger than your fist with continued bleeding, call your doctor.   After a c-section, you should expect a small amount of blood or clear fluid coming from the incision and abdominal cramping/soreness. Inspect your incision site daily. Stand in front of a mirror to look for any redness, incision opening, or discolored/odorness drainage. Take a shower daily and continue good hygiene. Use own towel and washcloth (do not share). Make sure your sheets on your bed are clean. No pets sleeping around your incision site. Dressing will be removed at your postpartum visit. If the dressing does become wet or soiled underneath, it is okay to remove it.   Activity: Do not lift > 10 lbs for 6 weeks (do not lift anything heavier than your baby). No intercourse, tampons, swimming pools, hot tubs, baths (only showers) for 6 weeks.  No driving for 1-2 weeks. Continue prenatal vitamin, especially if breastfeeding. Increase calories and fluids (water) while breastfeeding.   Your milk will come in, in the next couple of days (right now it is colostrum). You may have a slight fever when your milk comes in, but it should go away on its own.  If it does not, and rises above 101 F please call the doctor. You will also  feel achy and your breasts will be firm. They will also start to leak. If you are breastfeeding, continue as you have been and you can pump/express milk for comfort.   If you have too much milk, your breasts can become engorged, which could lead to mastitis. This is an infection of the milk ducts. It can be very painful and you will need to notify your doctor to obtain a prescription for antibiotics. You can also treat it with a shower or hot/cold compress.   For concerns about your baby, please call your pediatrician.  For breastfeeding concerns, the lactation consultant can be reached at [phone].   Postpartum blues (feelings of happy one minute and sad another minute) are normal for the first few weeks but if it gets worse let your doctor know.   Congratulations! We enjoyed caring for you and your new bundle of joy!

## 2021-07-25 NOTE — Brief Op Note (Signed)
07/25/2021  5:32 AM  PATIENT:  Debbie Burns  45 y.o. female  PRE-OPERATIVE DIAGNOSIS:   Active labor ,repeat c section, desires sterilization  POST-OPERATIVE DIAGNOSIS:   active labor , elective repeat c section, desires sterilization  PROCEDURE:  Procedure(s): CESAREAN SECTION WITH BILATERAL TUBAL LIGATION (N/A)  SURGEON:  Surgeon(s) and Role:    * Adreonna Yontz, Ihor Austin, MD - Primary  PHYSICIAN ASSISTANT: McVey , CNM   ASSISTANTS: none   ANESTHESIA:   spinal  EBL:  QBL : 455 IOF 800 cc ou 60 cc:   BLOOD ADMINISTERED:none  DRAINS: Urinary Catheter (Foley)   LOCAL MEDICATIONS USED:  MARCAINE     SPECIMEN:  Source of Specimen:  portion right and left tube   DISPOSITION OF SPECIMEN:  PATHOLOGY  COUNTS:  YES  TOURNIQUET:  * No tourniquets in log *  DICTATION: .Other Dictation: Dictation Number verbal  PLAN OF CARE: Admit to inpatient   PATIENT DISPOSITION:  PACU - hemodynamically stable.   Delay start of Pharmacological VTE agent (>24hrs) due to surgical blood loss or risk of bleeding: not applicable

## 2021-07-25 NOTE — Progress Notes (Signed)
Offered interpreter to patient upon entering room for admission assessment on postpartum unit. Patient declined. She stated she would let us know if she felt as though she needed one for any reason.   Spanish and english paperwork given to patient.

## 2021-07-25 NOTE — Discharge Summary (Signed)
Obstetrical Discharge Summary  Patient Name: Debbie Burns DOB: 1976/10/04 MRN: [id number]  Date of Admission: 07/25/2021 Date of Delivery: 07/25/21 Delivered by: Schermerhorn MD Date of Discharge: 07/26/2021  Primary OB: [facility]  LMP:No LMP recorded. EDC Estimated Date of Delivery: 08/03/21 Gestational Age at Delivery: [redacted]w[redacted]d   Antepartum complications:  1. A2GDM, on metformin, 500mg  qAM, 1000qPM 2. Advanced maternal age, 45yo 3. Desires BTL, consent 05/15/21 signed per [facility] notes on 07/20/21 4. Previous CS 09/2019 for mono-di twins 5. Hx SVD 2009 with G1 "severe vaginal Laceration"   Admitting Diagnosis: active labor, previous CS Secondary Diagnosis: repeat LTCS, Bilateral tubal sterilization  Patient Active Problem List   Diagnosis Date Noted   Labor and delivery, indication for care 07/25/2021   Monochorionic diamniotic twin gestation 07/23/2019   Advanced maternal age in multigravida, first trimester     Augmentation: N/A Complications: None Intrapartum complications/course: see Op note Date of Delivery: 07/25/21 Delivered By: Schermerhorn MD Delivery Type: repeat cesarean section, low transverse incision Anesthesia: spinal Placenta: manual Laceration: none Episiotomy: none Newborn Data: Live born female  Birth Weight:  6#10 APGAR: 8/9  Newborn Delivery   Birth date/time: 07/25/2021 05:01:00 Delivery type:       Postpartum Procedures: None  Edinburgh:  Edinburgh Postnatal Depression Scale Screening Tool 07/26/2021 07/25/2021 07/25/2021 07/25/2019  I have been able to laugh and see the funny side of things. 0 (No Data) (No Data) 0  I have looked forward with enjoyment to things. 0 - - 0  I have blamed myself unnecessarily when things went wrong. 0 - - 1  I have been anxious or worried for no good reason. 0 - - 0  I have felt scared or panicky for no good reason. 1 - - 0  Things have been getting on top of me. 1 - - 0  I have been so  unhappy that I have had difficulty sleeping. 1 - - 0  I have felt sad or miserable. 0 - - 0  I have been so unhappy that I have been crying. 0 - - 0  The thought of harming myself has occurred to me. 0 - - 0  Edinburgh Postnatal Depression Scale Total 3 - - 1      Post partum course-Cesarean Section:  Patient had an uncomplicated postpartum course.  By time of discharge on POD#1, her pain was controlled on oral pain medications; she had appropriate lochia and was ambulating, voiding without difficulty, tolerating regular diet and passing flatus.   She was deemed stable for discharge to home.    Discharge Physical Exam:  BP 91/62 (BP Location: Left Arm)   Pulse 72   Temp 97.6 F (36.4 C) (Oral)   Resp 18   Ht 5\' 2"  (1.575 m)   Wt 76 kg   SpO2 98%   Breastfeeding Unknown   BMI 30.65 kg/m   General: NAD CV: RRR Pulm: CTABL, nl effort ABD: s/nd/nt, fundus firm and below the umbilicus Lochia: moderate Incision: c/d/i, healing well, no significant drainage, no dehiscence, no significant erythema; covered with OP Site occlusive honey comb dressing  DVT Evaluation: LE non-ttp, no evidence of DVT on exam.  Hemoglobin  Date Value Ref Range Status  07/26/2021 10.0 (L) 12.0 - 15.0 g/dL Final    Comment:    REPEATED TO VERIFY   HCT  Date Value Ref Range Status  07/26/2021 28.6 (L) 36.0 - 46.0 % Final     Disposition: stable, discharge to home.  Baby Feeding: breastmilk Baby Disposition: home with mom  Rh Immune globulin given: n/a Rubella vaccine given: immune Varicella vaccine given: immune Tdap vaccine given in AP or PP setting: 06/19/21 Flu vaccine given in AP or PP setting: offered prior to discharge  Contraception: BTL   Prenatal Labs:  Blood type/Rh O pos  Antibody screen neg  Rubella Immune  Varicella Immune  RPR NR  HBsAg Neg  HIV NR  GC neg  Chlamydia neg  Genetic screening negative  1 hour GTT  185  3 hour GTT  [id number]; A1C 5.3  GBS  pending      Plan:  Debbie Burns was discharged to home in good condition. Follow-up appointment with delivering provider in 2 weeks.  Discharge Medications: Allergies as of 07/26/2021   No Known Allergies      Medication List     STOP taking these medications    ferrous sulfate 325 (65 FE) MG tablet       TAKE these medications    acetaminophen 500 MG tablet Commonly known as: TYLENOL Take 2 tablets (1,000 mg total) by mouth every 6 (six) hours. What changed:  when to take this reasons to take this   ibuprofen 600 MG tablet Commonly known as: ADVIL Take 1 tablet (600 mg total) by mouth every 6 (six) hours as needed for mild pain or cramping. What changed: reasons to take this   oxyCODONE 5 MG immediate release tablet Commonly known as: Oxy IR/ROXICODONE Take 1 tablet (5 mg total) by mouth every 4 (four) hours as needed for up to 7 days for moderate pain.   prenatal multivitamin Tabs tablet Take 1 tablet by mouth daily.   triamcinolone cream 0.1 % Commonly known as: KENALOG Apply to affected area on upper back once to twice daily prn Avoid applying to face, groin, and axilla. Use as directed.         Follow-up Information     Schermerhorn, Ihor Austin, MD. Go on 08/09/2021.   Specialty: Obstetrics and Gynecology Why: Follow-up post-op appointment: Thursday, 11/3 at 3:45pm with Dr. Feliberto Gottron at [facility]! Contact information: [address] [place] [address] [phone]                 Signed:  Al Corpus 07/26/2021  10:18 AM  Margaretmary Eddy, CNM Certified Nurse Midwife [facility]

## 2021-07-25 NOTE — Anesthesia Procedure Notes (Signed)
Spinal  Patient location during procedure: OB Start time: 07/25/2021 4:30 AM End time: 07/25/2021 4:38 AM Reason for block: surgical anesthesia Staffing Performed: resident/CRNA  Resident/CRNA: Katherine Basset, CRNA Preanesthetic Checklist Completed: patient identified, IV checked, site marked, risks and benefits discussed, surgical consent, monitors and equipment checked, pre-op evaluation and timeout performed Spinal Block Patient position: sitting Prep: ChloraPrep Patient monitoring: heart rate, continuous pulse ox and blood pressure Approach: midline Location: L3-4 Injection technique: single-shot Needle Needle type: Pencan  Needle gauge: 24 G Needle length: 10 cm Assessment Sensory level: T3 Events: CSF return

## 2021-07-25 NOTE — Op Note (Signed)
[name], Debbie I. MEDICAL RECORD NO: [id number] ACCOUNT NO: [id number] DATE OF BIRTH: Jul 28, 1976 FACILITY: [facility] LOCATION: [facility] PHYSICIAN: Suzy Bouchard, MD  Operative Report   DATE OF PROCEDURE: 07/25/2021   PREOPERATIVE DIAGNOSIS:   1.  38+5 weeks estimated gestational age. 2.  Active labor. 3.  Previous cesarean section, desires elective repeat cesarean section. 4.  Desires elective permanent sterilization.  POSTOPERATIVE DIAGNOSIS:   1.  38+5 weeks estimated gestational age. 2.  Active labor. 3.  Previous cesarean section, desires elective repeat cesarean section. 4.  Desires elective permanent sterilization. 5.  Vigorous female, delivered.  PROCEDURE:   1.  Repeat low transverse cesarean section. 2.  Bilateral tubal ligation - [place].  SURGEON:  Suzy Bouchard, MD  ASSISTANT:  Heloise Ochoa, certified nurse midwife.  ANESTHESIA:  Spinal.  INDICATIONS:  A 45 year old gravida 5, para 2 at 38+5 weeks estimated gestational age, who presents in active labor with cervix at 4 cm.  The patient has a previous cesarean section and has elected for repeat cesarean section.  The patient has also  elected for permanent sterilization.  DESCRIPTION OF PROCEDURE:  After adequate spinal anesthesia, the patient was placed in dorsal supine position, hip roll onto the right side.  The patient's abdomen was prepped and draped in normal sterile fashion.  Timeout was performed.  The patient did  receive 2 grams of IV Ancef prior to commencement of the case.  A Pfannenstiel incision was made 2 fingerbreadths above the symphysis pubis.  Sharp dissection was used to identify the fascia and the fascia was opened in a transverse fashion.  The  superior aspect of the fascia was grasped with Kocher clamps and the recti muscles were dissected free.  The inferior aspect of the fascia was grasped with Kocher clamps and pyramidalis muscle was dissected free.  Entry into the  peritoneal cavity was  accomplished sharply.  The vesicouterine peritoneal fold was identified, opened and the bladder was reflected inferiorly.  A low transverse uterine incision was made.  Upon entry into the endometrial cavity, clear fluid resulted.  Fetal head was brought  to the incision with fundal pressure, the head, shoulders and body were delivered without difficulty.  Infant was dried on the mother's abdomen for 60 seconds and the cord was then doubly clamped and a vigorous female was passed to the nursery staff who  assigned Apgar scores of 8 and 9.  Cord blood was obtained and the placenta was then manually delivered and the uterus was exteriorized.  Endometrial cavity was wiped clean with a laparotomy tape.  Uterine incision was then closed with 1 chromic suture  in a running locking fashion.  Additional figure-of-eight sutures were required for good hemostasis.  Attention was directed to the patient's right fallopian tube, which was grasped with the midportion 2 separate 0 plain gut sutures were tied and a 1.5  cm portion of fallopian tube was removed.  Good hemostasis was noted.  Similar procedure was repeated on the patient's left fallopian tube after placing 2-0 plain gut sutures, a 1.5 cm portion of fallopian tube was removed.  There was some oozing from  the suture site.  This area was clamped with a Kelly clamp and a 2-0 Vicryl suture was used to secure the bleeding site.  Good hemostasis was noted.  Posterior cul-de-sac was irrigated and suctioned and the uterus was placed back into the abdominal  cavity and the paracolic gutters were wiped clean with laparotomy tape and the tubal  ligation sites appeared hemostatic.  The uterine incision appeared hemostatic as well.  Interceed was placed over the uterine incision in a T-shape fashion.  Fascia was  then closed with 0 Vicryl suture in a running nonlocking fashion.  Good approximation of edges.  Fascial edges were injected with the solution  of 60 mL of 0.5% Marcaine plus 20 mL normal saline.  Approximately 40 mL of the solution was used.   Subcutaneous tissues were irrigated and Bovie was used to control any small bleeding sites.  The skin was then reapproximated with Insorb absorbable staples.  Good cosmetic effect.  Additional 30 mL of Marcaine solution was injected beneath the skin.   There were no complications.    Quantitative blood loss 455 mL.  INTRAOPERATIVE FLUIDS:  800 mL.  URINE OUTPUT:  60 mL.  The patient was taken to recovery room in good condition.   [email or web address] D: 07/25/2021 5:53:26 am T: 07/25/2021 6:44:00 am  JOB: [id number]/ [id number]

## 2021-07-25 NOTE — Anesthesia Preprocedure Evaluation (Signed)
Anesthesia Evaluation  Patient identified by MRN, date of birth, ID band Patient awake    Reviewed: Allergy & Precautions, NPO status , Patient's Chart, lab work & pertinent test results  History of Anesthesia Complications Negative for: history of anesthetic complications  Airway Mallampati: III  TM Distance: >3 FB Neck ROM: full    Dental  (+) Chipped   Pulmonary neg pulmonary ROS, neg shortness of breath,    Pulmonary exam normal        Cardiovascular Exercise Tolerance: Good (-) hypertension(-) angina(-) Past MI negative cardio ROS Normal cardiovascular exam     Neuro/Psych    GI/Hepatic negative GI ROS, neg GERD  ,  Endo/Other    Renal/GU   negative genitourinary   Musculoskeletal   Abdominal   Peds  Hematology negative hematology ROS (+)   Anesthesia Other Findings History reviewed. No pertinent past medical history.  Past Surgical History: 2015: BREAST SURGERY 07/23/2019: CESAREAN SECTION; N/A     Comment:  Procedure: CESAREAN SECTION;  Surgeon: Ward, Elenora Fender,               MD;  Location: [facility] ORS;  Service: Obstetrics;                Laterality: N/A; 2019: LIPOSUCTION     Comment:  Abdominal x2   BMI    Body Mass Index: 30.65 kg/m      Reproductive/Obstetrics (+) Pregnancy                             Anesthesia Physical Anesthesia Plan  ASA: 2 and emergent  Anesthesia Plan: Spinal   Post-op Pain Management:    Induction:   PONV Risk Score and Plan:   Airway Management Planned: Natural Airway and Nasal Cannula  Additional Equipment:   Intra-op Plan:   Post-operative Plan:   Informed Consent: I have reviewed the patients History and Physical, chart, labs and discussed the procedure including the risks, benefits and alternatives for the proposed anesthesia with the patient or authorized representative who has indicated his/her understanding and  acceptance.     Dental Advisory Given  Plan Discussed with: Anesthesiologist, CRNA and Surgeon  Anesthesia Plan Comments: (Patient declined interpreter   Patient reports no bleeding problems and no anticoagulant use.  Plan for spinal with backup GA  Patient consented for risks of anesthesia including but not limited to:  - adverse reactions to medications - damage to eyes, teeth, lips or other oral mucosa - nerve damage due to positioning  - risk of bleeding, infection and or nerve damage from spinal that could lead to paralysis - risk of headache or failed spinal - damage to teeth, lips or other oral mucosa - sore throat or hoarseness - damage to heart, brain, nerves, lungs, other parts of body or loss of life  Patient voiced understanding.)        Anesthesia Quick Evaluation

## 2021-07-25 NOTE — OB Triage Note (Signed)
Pt is G2P2 (twins) presenting with painful irregular UCs since am increasing in intensity and timing. Presently q5 min and pain score 7/10. Reports +FM, no LOF or bleeding

## 2021-07-25 NOTE — H&P (Signed)
OB History & Physical   History of Present Illness:  Chief Complaint: contractions  HPI:  Sheri Gatchel is a 45 y.o. [id number] female at [redacted]w[redacted]d dated by [place] at [redacted]w[redacted]d. EDD 08/10/21, She presents to L&D for painful UCs.   Active FM, onset of ctx yesterday, that worsened around midnight tonight,  currently every 3-6 minutes, denies LOF  or VB. Having a small amount of bloody show.      Pregnancy Issues: 1. A2GDM, on metformin, 500mg  qAM, 1000qPM 2. Advanced maternal age, 45yo 3. Desires BTL, consent 05/15/21 signed per [facility] notes on 07/20/21 4. Previous CS 09/2019 for mono-di twins 5. Hx SVD 2009 with G1 "severe vaginal Laceration"    Maternal Medical History:  History reviewed. No pertinent past medical history.  Past Surgical History:  Procedure Laterality Date   BREAST SURGERY  2015   CESAREAN SECTION N/A 07/23/2019   Procedure: CESAREAN SECTION;  Surgeon: Ward, 07/25/2019, MD;  Location: [facility] ORS;  Service: Obstetrics;  Laterality: N/A;   LIPOSUCTION  2019   Abdominal x2     No Known Allergies  Prior to Admission medications   Medication Sig Start Date End Date Taking? Authorizing Provider  acetaminophen (TYLENOL) 500 MG tablet Take 2 tablets (1,000 mg total) by mouth every 6 (six) hours as needed for mild pain or moderate pain. Patient not taking: Reported on 04/19/2020 07/25/19   07/27/19, CNM  ferrous sulfate 325 (65 FE) MG tablet Take 1 tablet (325 mg total) by mouth 2 (two) times daily with a meal. Patient not taking: Reported on 04/19/2020 07/25/19   07/27/19, CNM  ibuprofen (ADVIL) 600 MG tablet Take 1 tablet (600 mg total) by mouth every 6 (six) hours as needed for mild pain, moderate pain or cramping. Patient not taking: No sig reported 07/25/19   07/27/19, CNM  oxyCODONE (OXY IR/ROXICODONE) 5 MG immediate release tablet Take 1 tablet (5 mg total) by mouth every 4 (four) hours as needed for moderate pain. Patient not taking: No sig  reported 07/25/19   Ward, 07/27/19, MD  Prenatal Vit-Fe Fumarate-FA (PRENATAL MULTIVITAMIN) TABS tablet Take 1 tablet by mouth daily.  Patient not taking: Reported on 04/19/2020    [provider]  triamcinolone cream (KENALOG) 0.1 % Apply to affected area on upper back once to twice daily prn Avoid applying to face, groin, and axilla. Use as directed. 04/19/20   04/21/20, MD     Prenatal care site: [facility]  Social History: She  reports that she has never smoked. She has never used smokeless tobacco. She reports that she does not currently use alcohol. She reports that she does not use drugs.  Family History: family history includes Cancer in her father and paternal grandmother; Hyperlipidemia in her daughter and mother.   Review of Systems: A full review of systems was performed and negative except as noted in the HPI.     Physical Exam:  Vital Signs: BP 121/79   Pulse 85   Temp 97.9 F (36.6 C) (Oral)   Resp 16   Ht 5\' 2"  (1.575 m)   Wt 76 kg   BMI 30.65 kg/m  General: no acute distress.  HEENT: normocephalic, atraumatic Heart: regular rate & rhythm.  No murmurs/rubs/gallops Lungs: clear to auscultation bilaterally, normal respiratory effort Abdomen: soft, gravid, non-tender;  EFW: 7lbs Pelvic:   External: Normal external female genitalia  Cervix: Dilation: 4 / Effacement (%): 80 / Station: -2    Extremities: non-tender,  symmetric, no edema bilaterally.  DTRs: 2+  Neurologic: Alert & oriented x 3.    Results for orders placed or performed during the hospital encounter of 07/25/21 (from the past 24 hour(s))  Glucose, capillary     Status: Abnormal   Collection Time: 07/25/21 12:53 AM  Result Value Ref Range   Glucose-Capillary 104 (H) 70 - 99 mg/dL  Resp Panel by RT-PCR (Flu A&B, Covid) Nasopharyngeal Swab     Status: None   Collection Time: 07/25/21  1:30 AM   Specimen: Nasopharyngeal Swab; Nasopharyngeal(NP) swabs in vial transport medium  Result  Value Ref Range   SARS Coronavirus 2 by RT PCR NEGATIVE NEGATIVE   Influenza A by PCR NEGATIVE NEGATIVE   Influenza B by PCR NEGATIVE NEGATIVE  CBC     Status: Abnormal   Collection Time: 07/25/21  1:38 AM  Result Value Ref Range   WBC 13.7 (H) 4.0 - 10.5 K/uL   RBC 4.04 3.87 - 5.11 MIL/uL   Hemoglobin 13.4 12.0 - 15.0 g/dL   HCT 16.3 84.5 - 36.4 %   MCV 93.3 80.0 - 100.0 fL   MCH 33.2 26.0 - 34.0 pg   MCHC 35.5 30.0 - 36.0 g/dL   RDW 68.0 32.1 - 22.4 %   Platelets 195 150 - 400 K/uL   nRBC 0.0 0.0 - 0.2 %  Type and screen [facility]     Status: None   Collection Time: 07/25/21  1:38 AM  Result Value Ref Range   ABO/RH(D) O POS    Antibody Screen NEG    Sample Expiration      07/28/2021,2359 Performed at [facility], [address] [place], [place] [address]     Pertinent Results:  Prenatal Labs: Blood type/Rh O pos  Antibody screen neg  Rubella Immune  Varicella Immune  RPR NR  HBsAg Neg  HIV NR  GC neg  Chlamydia neg  Genetic screening negative  1 hour GTT  185  3 hour GTT  [id number]; A1C 5.3  GBS  pending   FHT: 140bpm, mod variability, + accels, no decels TOCO: q3-39min SVE:  Dilation: 4 / Effacement (%): 80 / Station: -2    Cephalic by leopolds/SVE  No results found.  Assessment:  Jennah Satchell Nieto-Valles is a 45 y.o. [id number] female at [redacted]w[redacted]d with active labor.   Plan:  1. Admit to Labor & Delivery; consents reviewed and obtained - Called Dr Feliberto Gottron, pt to have repeat CS due to active labor.   2. Fetal Well being  - Fetal Tracing: Cat I - Group B Streptococcus ppx indicated: Negative - Presentation: cephalic confirmed by exam   3. Routine OB: - Prenatal labs reviewed, as above - Rh O pos - CBC, T&S, RPR on admit - NPO, IVF  4. Post Partum Planning: - Infant feeding: both - Contraception: BTL - Tdap 06/19/21  Prudencio Pair Verbon Giangregorio, CNM 07/25/21 4:03 AM

## 2021-07-25 NOTE — Transfer of Care (Signed)
Immediate Anesthesia Transfer of Care Note  Patient: Debbie Burns  Procedure(s) Performed: CESAREAN SECTION WITH BILATERAL TUBAL LIGATION  Patient Location: Mother/Baby  Anesthesia Type:Spinal  Level of Consciousness: awake, alert  and oriented  Airway & Oxygen Therapy: Patient Spontanous Breathing  Post-op Assessment: Report given to RN and Post -op Vital signs reviewed and stable  Post vital signs: Reviewed and stable  Last Vitals:  Vitals Value Taken Time  BP    Temp    Pulse    Resp    SpO2      Last Pain:  Vitals:   07/25/21 0330  TempSrc:   PainSc: 10-Worst pain ever         Complications: No notable events documented.

## 2021-07-25 NOTE — OB Triage Note (Signed)
Update G3/P2 GDM Last ate steak and tortillas at 2100 10/18

## 2021-07-26 LAB — CBC
HCT: 28.6 % — ABNORMAL LOW (ref 36.0–46.0)
Hemoglobin: 10 g/dL — ABNORMAL LOW (ref 12.0–15.0)
MCH: 32.1 pg (ref 26.0–34.0)
MCHC: 35 g/dL (ref 30.0–36.0)
MCV: 91.7 fL (ref 80.0–100.0)
Platelets: 143 10*3/uL — ABNORMAL LOW (ref 150–400)
RBC: 3.12 MIL/uL — ABNORMAL LOW (ref 3.87–5.11)
RDW: 14.2 % (ref 11.5–15.5)
WBC: 10.4 10*3/uL (ref 4.0–10.5)
nRBC: 0 % (ref 0.0–0.2)

## 2021-07-26 LAB — SURGICAL PATHOLOGY

## 2021-07-26 MED ORDER — FERROUS SULFATE 325 (65 FE) MG PO TABS
325.0000 mg | ORAL_TABLET | Freq: Two times a day (BID) | ORAL | Status: DC
  Administered 2021-07-26: 325 mg via ORAL
  Filled 2021-07-26: qty 1

## 2021-07-26 MED ORDER — OXYCODONE HCL 5 MG PO TABS: 5.0000 mg | ORAL_TABLET | ORAL | 0 refills | Status: AC | PRN

## 2021-07-26 MED ORDER — IBUPROFEN 600 MG PO TABS: 600.0000 mg | ORAL_TABLET | Freq: Four times a day (QID) | ORAL | 1 refills | Status: AC | PRN

## 2021-07-26 MED ORDER — ACETAMINOPHEN 500 MG PO TABS: 1000.0000 mg | ORAL_TABLET | Freq: Four times a day (QID) | ORAL | 0 refills | Status: AC

## 2021-07-26 NOTE — Lactation Note (Signed)
This note was copied from a baby's chart. Lactation Consultation Note  Patient Name: Boy Zoya Sprecher [id number] Date: 07/26/2021 Reason for consult: Follow-up assessment;Early term 37-38.6wks Age:43 hours  Lactation follow-up. Mom desires discharge home today, 29hrs post c-section. Baby continues to feed well, minimal weight loss, passed 24hr screens and adequate wet/stool diapers noted. Mom has introduced formula, 1x, per her plan to transition to formula feeding once she returns to work in January. Lactation encouraged continued breastfeeding, limiting formula given in efforts to promote an adequate milk supply. Mom verbalizes understanding.  No concerns or questions asked.   Maternal Data Has patient been taught Hand Expression?: Yes Does the patient have breastfeeding experience prior to this delivery?: Yes How long did the patient breastfeed?: 1 month  Feeding Mother's Current Feeding Choice: Breast Milk and Formula  LATCH Score                    Lactation Tools Discussed/Used    Interventions Interventions: Education  Discharge Pump:  (doesn't think she will be able to at work)  Consult Status Consult Status: Complete    Danford Bad 07/26/2021, 10:08 AM

## 2021-07-26 NOTE — Anesthesia Postprocedure Evaluation (Signed)
Anesthesia Post Note  Patient: Debbie Burns  Procedure(s) Performed: CESAREAN SECTION WITH BILATERAL TUBAL LIGATION  Patient location during evaluation: Mother Baby Anesthesia Type: Spinal Level of consciousness: oriented and awake and alert Pain management: pain level controlled Vital Signs Assessment: post-procedure vital signs reviewed and stable Respiratory status: spontaneous breathing Cardiovascular status: blood pressure returned to baseline and stable Postop Assessment: no headache, no backache, no apparent nausea or vomiting, patient able to bend at knees and able to ambulate Anesthetic complications: no   No notable events documented.   Last Vitals:  Vitals:   07/26/21 0400 07/26/21 0500  BP:    Pulse: 70 72  Resp:    Temp:    SpO2: 97% 97%    Last Pain:  Vitals:   07/25/21 2337  TempSrc: Oral  PainSc:                  Cleda Mccreedy Naveen Lorusso

## 2021-07-26 NOTE — Progress Notes (Signed)
Patient discharged home with infant. Discharge instructions, prescriptions and follow up appointment given to and reviewed with patient. Patient verbalized understanding. Pt wheeled out with infant by auxiliary.  

## 2021-07-26 NOTE — Anesthesia Post-op Follow-up Note (Signed)
  Anesthesia Pain Follow-up Note  Patient: Debbie Burns  Day #: 1  Date of Follow-up: 07/26/2021 Time: 7:35 AM  Last Vitals:  Vitals:   07/26/21 0400 07/26/21 0500  BP:    Pulse: 70 72  Resp:    Temp:    SpO2: 97% 97%    Level of Consciousness: alert  Pain: mild   Side Effects:None  Catheter Site Exam:clean, dry  Anti-Coag Meds (From admission, onward)   Start     Dose/Rate Route Frequency Ordered Stop   07/26/21 0600  enoxaparin (LOVENOX) injection 40 mg        40 mg Subcutaneous Every 24 hours 07/25/21 0910         Plan: D/C from anesthesia care at surgeon's request  Cleda Mccreedy Lolita Faulds

## 2021-08-01 ENCOUNTER — Other Ambulatory Visit: Payer: No Typology Code available for payment source

## 2021-08-06 ENCOUNTER — Other Ambulatory Visit: Payer: No Typology Code available for payment source

## 2021-08-07 ENCOUNTER — Inpatient Hospital Stay: Admit: 2021-08-07 | Payer: No Typology Code available for payment source | Admitting: Obstetrics and Gynecology
